# Patient Record
Sex: Male | Born: 1975
Health system: Southern US, Community
[De-identification: ages and names within clinical notes are randomized; demographics above are authoritative.]

## PROBLEM LIST (undated history)

## (undated) DIAGNOSIS — E785 Hyperlipidemia, unspecified: Secondary | ICD-10-CM

## (undated) HISTORY — DX: Hyperlipidemia, unspecified: E78.5

---

## 1998-04-13 ENCOUNTER — Emergency Department (HOSPITAL_COMMUNITY): Admission: EM | Admit: 1998-04-13 | Discharge: 1998-04-13 | Payer: Self-pay | Admitting: Emergency Medicine

## 2008-09-27 ENCOUNTER — Inpatient Hospital Stay (HOSPITAL_COMMUNITY): Admission: AC | Admit: 2008-09-27 | Discharge: 2008-09-30 | Payer: Self-pay | Admitting: Emergency Medicine

## 2008-10-05 ENCOUNTER — Inpatient Hospital Stay (HOSPITAL_COMMUNITY): Admission: RE | Admit: 2008-10-05 | Discharge: 2008-10-07 | Payer: Self-pay | Admitting: Orthopedic Surgery

## 2010-04-02 IMAGING — CT CT EXTREM UP W/O CM*L*
2 of 4 series · 7 of 14 positions shown, 8 images · non-contrast
Comparison: Plain film 09/28/2008, 1133 hours.

CLINICAL DATA: Left clavicle and scapular fracture.

CT OF THE UPPER LEFT EXTREMITY WITHOUT CONTRAST
TECHNIQUE: Multidetector CT imaging of the upper left extremity
was performed according to the standard protocol. Multiplanar CT
image reconstructions were also generated.

[Series 2: shoulder · axial · 0.52mm/px · z∈[+146,+273]mm · 4 of 169 slices shown]
[im 34/169  bone]
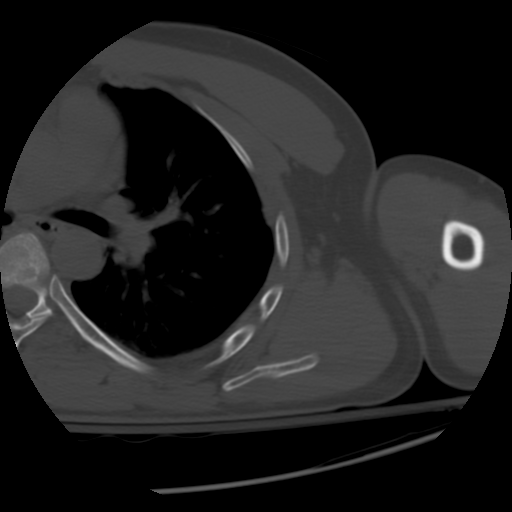
[im 68/169  bone]
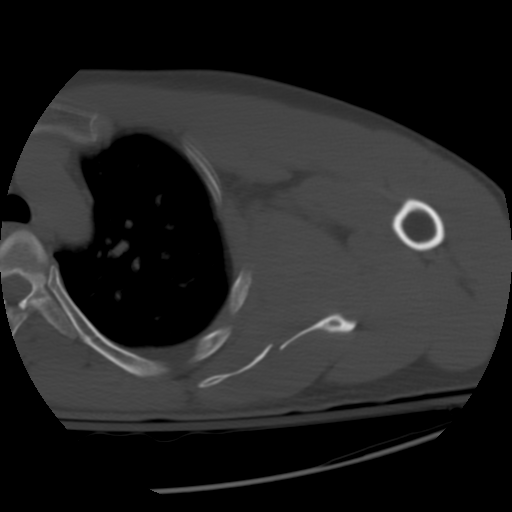
[im 101/169  bone]
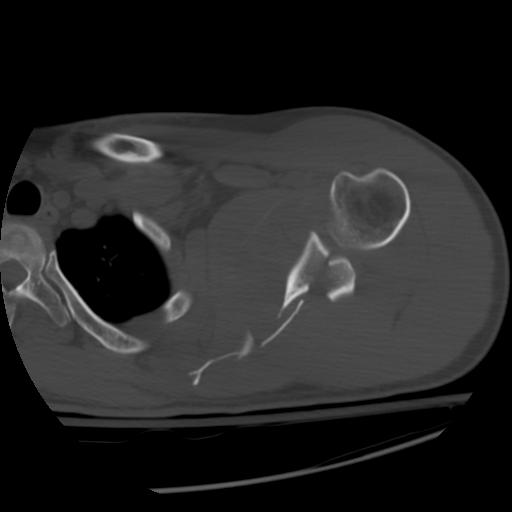
[im 135/169  bone]
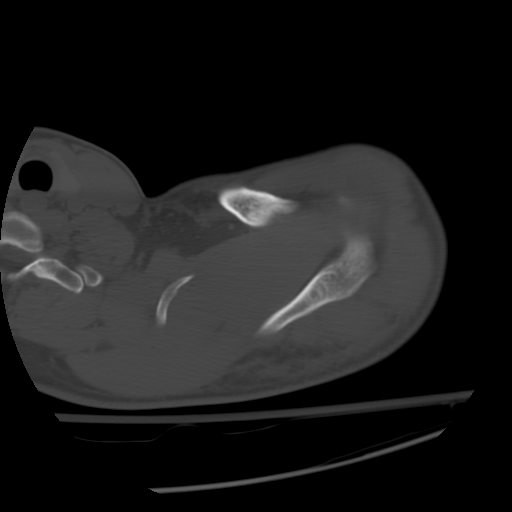

[Series 105: reformatted · axial · 0.52mm/px · z∈[+104,+343]mm · 3 of 84 slices shown, 4 images]
[im 1/84  soft-tissue]
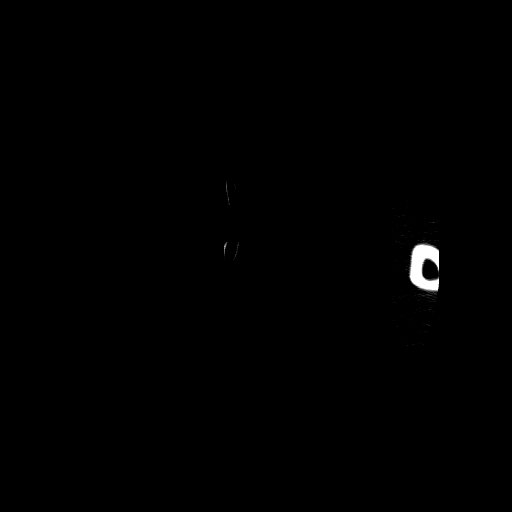
[im 1/84  bone]
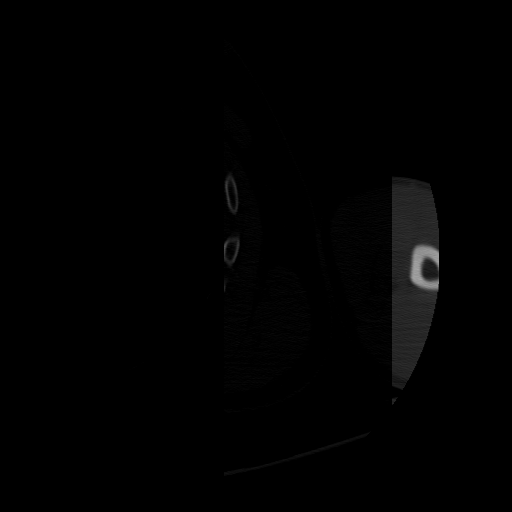
[im 42/84  bone]
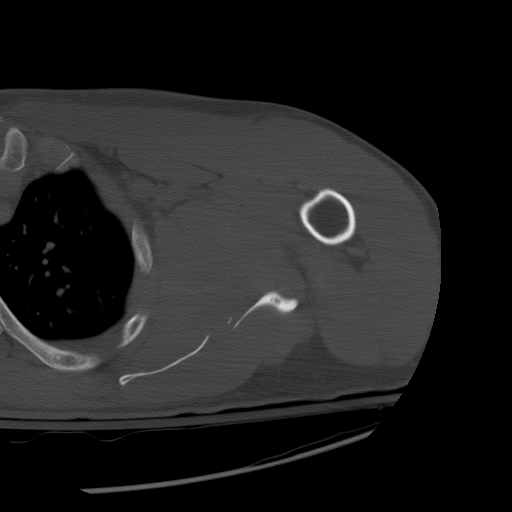
[im 84/84  bone]
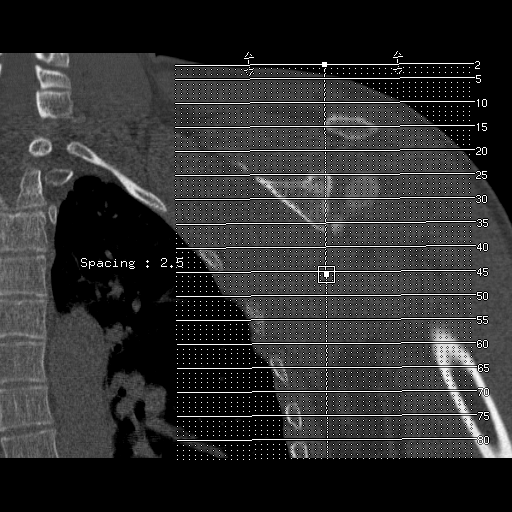

[7 of 14 positions shown; findings below may reference images not displayed]

FINDINGS: There is a comminuted fracture of the left scapula, which
extends from scapular body into the glenoid.  Glenoid fragments are
comminuted and displaced posteriorly.  The coracoid and base of the
coracoid appear intact.  Fractures do extend into the infra glenoid
tubercle.  There is posterior displacement of the lateral aspect of
the scapular body.  There is no extension of scapular body
fractures into the spine.  The fracture in the glenoid involves the
inferior one half, extending from the 4 o'clock position to the 9
o'clock position.  Humerus appears intact.  AC joint appears
intact.  Nondisplaced mid shaft clavicle fracture is present.
Visualized chest appears unremarkable.
IMPRESSION: Comminuted fracture of the left scapula extending from inferior
glenoid through the inferior scapular body.  No extension into the
scapular spine or coracoid.  Acromion is also intact.  Transverse
mid shaft left clavicle fracture.

## 2010-04-09 IMAGING — CR DG SHOULDER 1V*L*
1 series · 1 of 1 positions shown · non-contrast
Comparison: CT dated 09/28/2008

CLINICAL DATA: Left scapular ORIF.

PORTABLE LEFT SHOULDER - 2+ VIEW

[view not recorded]
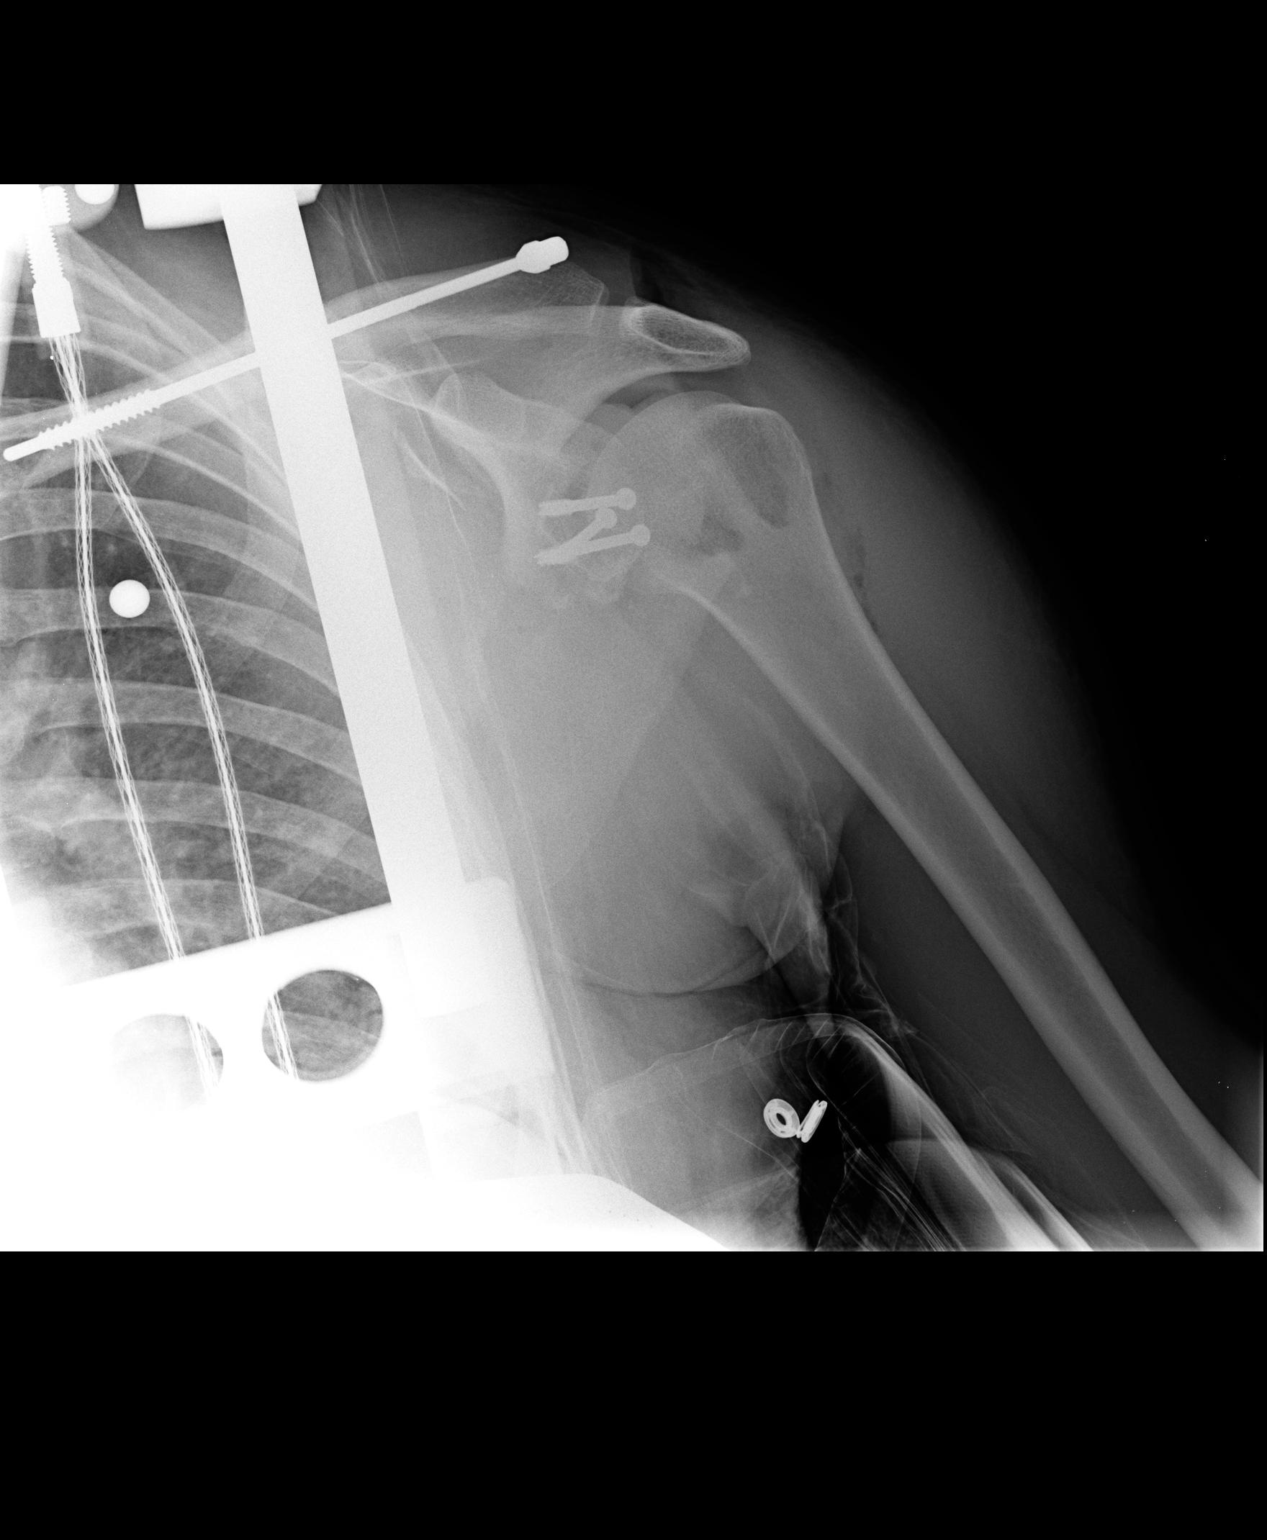

[1 of 1 positions shown; findings below may reference images not displayed]

FINDINGS: Screws are present at the level of a glenoid fracture.
In the frontal projection alignment appears unremarkable.  There is
no evidence of humeral fracture.
IMPRESSION: Imaging status post ORIF of a left glenoid scapular fracture.

## 2010-04-09 IMAGING — RF DG CLAVICLE*L*
1 series · 1 of 1 positions shown · non-contrast
Comparison: 09/28/2008

CLINICAL DATA: ORIF left clavicle fracture

LEFT CLAVICLE - 2+ VIEWS

[Series 1: run · 1 of 1 slices shown]
[im 1/1]
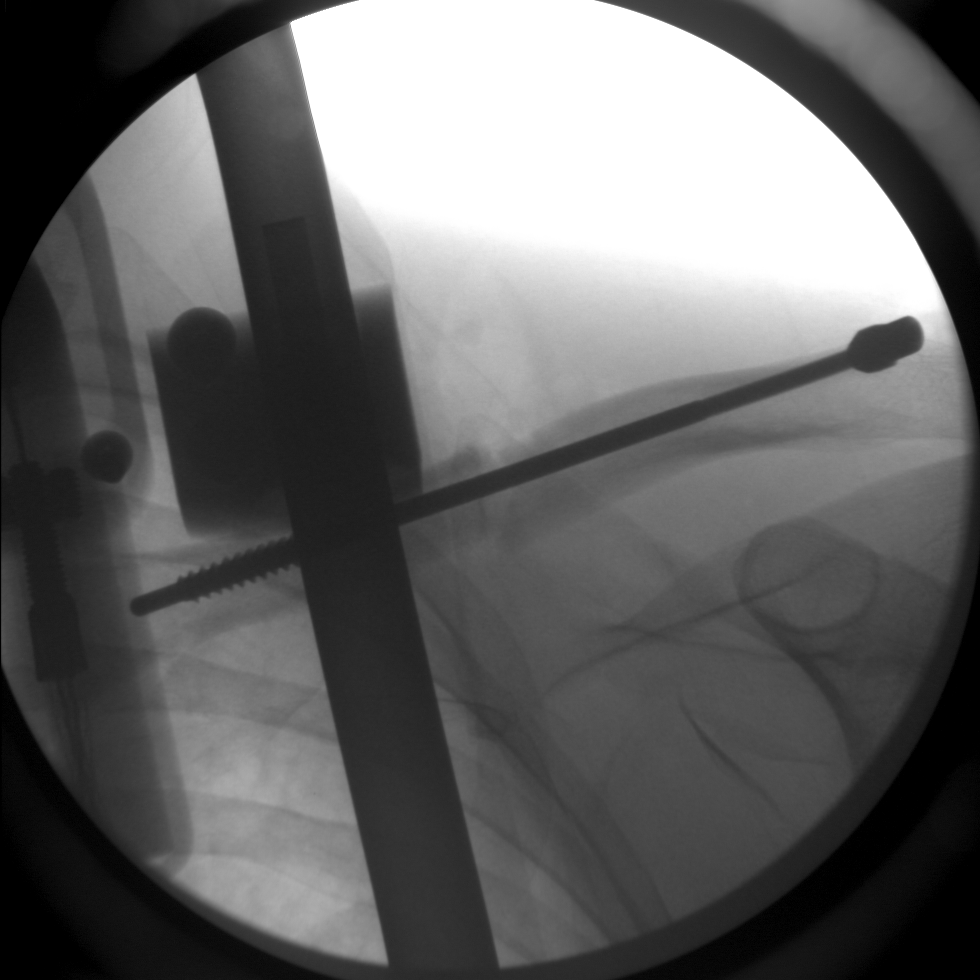

[1 of 1 positions shown; findings below may reference images not displayed]

FINDINGS: Single digital C-arm fluoroscopic image obtained intraoperatively
and submitted for interpretation.
Examination is interpreted postoperatively.
Single pin identified across mid-left clavicular fracture.
Acromioclavicular and sternoclavicular joint alignments not
visualized.
IMPRESSION: ORIF mid left clavicular fracture.

## 2010-08-28 LAB — BASIC METABOLIC PANEL
BUN: 17 mg/dL (ref 6–23)
CO2: 30 mEq/L (ref 19–32)
CO2: 28 mEq/L (ref 19–32)
Calcium: 9 mg/dL (ref 8.4–10.5)
Calcium: 9.8 mg/dL (ref 8.4–10.5)
Chloride: 103 mEq/L (ref 96–112)
GFR calc Af Amer: >60 mL/min (ref >60)
GFR calc non Af Amer: >60 mL/min (ref >60)
GFR calc non Af Amer: >60 mL/min (ref >60)
Glucose, Bld: 109 mg/dL — ABNORMAL HIGH (ref 70–99)
Glucose, Bld: 122 mg/dL — ABNORMAL HIGH (ref 70–99)
Glucose, Bld: 135 mg/dL — ABNORMAL HIGH (ref 70–99)
Potassium: 4.3 mEq/L (ref 3.5–5.1)
Sodium: 140 mEq/L (ref 135–145)
Sodium: 138 mEq/L (ref 135–145)

## 2010-08-28 LAB — PROTIME-INR: Prothrombin Time: 13.4 seconds (ref 11.6–15.2)

## 2010-08-28 LAB — CBC
HCT: 38.8 % — ABNORMAL LOW (ref 39.0–52.0)
HCT: 43.1 % (ref 39.0–52.0)
Hemoglobin: 13.7 g/dL (ref 13.0–17.0)
Hemoglobin: 14.8 g/dL (ref 13.0–17.0)
MCHC: 35.2 g/dL (ref 30.0–36.0)
MCV: 88.4 fL (ref 78.0–100.0)
Platelets: 232 10*3/uL (ref 150–400)
RBC: 4.9 MIL/uL (ref 4.22–5.81)
RBC: 5.24 MIL/uL (ref 4.22–5.81)
RDW: 12.9 % (ref 11.5–15.5)
RDW: 13.1 % (ref 11.5–15.5)
RDW: 13.4 % (ref 11.5–15.5)
WBC: 7.2 10*3/uL (ref 4.0–10.5)

## 2010-08-28 LAB — POCT I-STAT, CHEM 8
BUN: 10 mg/dL (ref 6–23)
Creatinine, Ser: 1.3 mg/dL (ref 0.4–1.5)
Hemoglobin: 16.3 g/dL (ref 13.0–17.0)
Potassium: 3.2 mEq/L — ABNORMAL LOW (ref 3.5–5.1)
Sodium: 138 mEq/L (ref 135–145)

## 2010-08-28 LAB — DIFFERENTIAL
Basophils Absolute: 0.1 10*3/uL (ref 0.0–0.1)
Basophils Relative: 1 % (ref 0–1)
Eosinophils Relative: 1 % (ref 0–5)
Lymphocytes Relative: 22 % (ref 12–46)
Lymphs Abs: 3.3 10*3/uL (ref 0.7–4.0)
Monocytes Relative: 9 % (ref 3–12)
Neutro Abs: 4 10*3/uL (ref 1.7–7.7)
Neutro Abs: 3.1 10*3/uL (ref 1.7–7.7)
Neutrophils Relative %: 43 % (ref 43–77)

## 2010-08-28 LAB — TYPE AND SCREEN
ABO/RH(D): AB NEG
ABO/RH(D): AB NEG
Antibody Screen: NEGATIVE

## 2010-08-28 LAB — ABO/RH: ABO/RH(D): AB NEG

## 2010-10-02 NOTE — Discharge Summary (Signed)
NAME:  [name] [name], [name] [name]                 ACCOUNT NO.:  [id number]   MEDICAL RECORD NO.:  [id number]          PATIENT TYPE:  INP   LOCATION:  5007                         FACILITY:  [facility]   PHYSICIAN:  Cherylynn Ridges, M.D.    DATE OF BIRTH:  05-15-1976   DATE OF ADMISSION:  09/27/2008  DATE OF DISCHARGE:  09/30/2008                               DISCHARGE SUMMARY   DISCHARGE DIAGNOSES:  1. Fall from a horse.  2. Concussion with amnesia for the event and confusion following the      event.  3. Left clavicle fracture.  4. Comminuted left scapular fracture.  5. Left shin abrasion.   ADMITTING TRAUMA SURGEON:  Gabrielle Dare. Janee Morn, MD.   Felton ClintonMadlyn Frankel. Charlann Boxer, MD, Orthopedic surgery.   PROCEDURES:  None.   HISTORY:  This is an otherwise healthy 35 year old white male who was  thrown from a horse.  There was an unknown loss of consciousness.  He  has, however, been amnesic for the event and quite confused since the  event with repetitive questions.  He came in as a level 2 trauma alert.  Workup at this time including a chest x-ray revealed a left clavicle  fracture.  Pelvic film was negative.  CT scan of the head was without  acute intracranial abnormality.  CT scan of the C-spine was negative for  acute fractures.  Chest CT scan showed left clavicle fracture and left  comminuted scapular fracture.  Abdomen and pelvic CT scan was negative  for acute injuries.  The patient was admitted for his concussion and  severe left shoulder trauma with comminuted scapular fracture and  clavicle.  The patient's mental status quickly cleared.  His wife is  available for 24-hour day supervision on discharge.   Left clavicle, scapula, and glenoid fractures:  The patient was seen in  consultation per Dr. Charlann Boxer for his orthopedic injuries, and it was felt  he would likely require ORIF of his clavicle, glenoid, and scapula.  He  will be able to discharge today with plans to have this surgery next  week, likely Wednesday of next week with Dr. Ranell Patrick.   At this time, the patient was medically stable, improved, and ready for  discharge.   MEDICATIONS AT THE TIME DISCHARGE:  1. Robaxin 500 mg p.o. q.6 h. p.r.n. muscle spasm or pain #90 with 1      refill.  2. Norco 7.5 mg 1-2 p.o. q.4-6 h. p.r.n. pain #80, no refill.  3. Colace 100 mg p.o. b.i.d.  4. MiraLax 17 g daily as needed for constipation or other laxatives as      needed for constipation.   The patient is to call to make an appointment with Dr. Ranell Patrick next week.  He can call Trauma Service as needed for questions or concerns.   Diet is regular.  He is to wear his left arm sling as instructed.      Shawn Rayburn, P.A.      Cherylynn Ridges, M.D.  Electronically Signed    SR/MEDQ  D:  09/30/2008  T:  09/30/2008  Job:  [id number]   cc:   Almedia Balls. Ranell Patrick, M.D.  Madlyn Frankel Charlann Boxer, M.D.  [facility]

## 2010-10-02 NOTE — Consult Note (Signed)
NAME:  [name], [name]                 ACCOUNT NO.:  [id number]   MEDICAL RECORD NO.:  [id number]          PATIENT TYPE:  INP   LOCATION:  5007                         FACILITY:  [facility]   PHYSICIAN:  Madlyn Frankel. Charlann Boxer, M.D.  DATE OF BIRTH:  1976-02-27   DATE OF CONSULTATION:  09/27/2008  DATE OF DISCHARGE:                                 CONSULTATION   CHIEF COMPLAINT:  Left shoulder injury.   HISTORY:  [name] is an otherwise healthy 35 year old male who was riding  his horse at home when apparently it bucked him off, landing on his left  side.  In the emergency room, he was seen and evaluated and noted to  have significant concussion with memory loss.  Trauma evaluation  included chest CT and chest x-ray indicated nondisplaced left clavicle  fracture, but scapular body fracture extended to the glenoid surface of  displacement.  Orthopedics was consulted at the time of evaluation.  He  was seen in the emergency room with some memory loss, but his wife was  present to help with evaluation.  He had no other complaints today of  right upper extremity or lower extremity issues.  He states his left  wrist and elbow feels fine.  He has no complaints of numbness and  tingling.  There is significant pain in the left shoulder area.   PAST MEDICAL HISTORY:  Negative.   PAST SURGICAL HISTORY:  Right both bone fracture with open reduction and  internal fixation followed by a removal of plate.   CURRENT MEDICATIONS:  None.   DRUG ALLERGIES:  None.   SOCIAL HISTORY:  He works as a Surveyor, minerals.  He is right-hand dominant.  Denies smoking and has social alcohol use.   REVIEW OF SYSTEMS:  Otherwise unremarkable.   PHYSICAL EXAMINATION:  GENERAL:  At this time, he is in obvious  discomfort.  He is a very pleasant male that cannot the recall the event  of the accident.  EXTREMITIES:  He has significant swelling about the left shoulder with  pain to palpation.  Limited range of motion of the shoulder  and that is  not performed.  He has ,otherwise, palpable pulses and intact  sensibility distally, light touch, as well as laterally.  No other  obvious deformity, bruising, or significant injuries to the lower  extremities or upper extremities.   Radiograph of his left shoulder were limited to the chest CT and chest x-  ray.  These reveal nondisplaced left clavicle fracture in addition to  his capsular body intraarticular glenoid fracture.   ASSESSMENT:  As above.   PLAN:  I have reviewed with [name] and his wife the current situation at  this point and we are recommending a plain x-ray evaluation of the  shoulder in addition to the CT scan of the left shoulder with  reconstruction use.  I will then consult Dr. Malon Kindle, Dr. Myrene Galas for their evaluation.  Intraarticular glenoid fracture in this  patient most likely require an open reduction and internal fixation for  better align and the glenoid surface to provide  the limited potential  for excessive intraarticular posttraumatic orthopedic disease down the  road.  Otherwise, he was found being in placed in sling and on pain  medications and observation due to his concussion, and once clear for  that, then may be an operation during this hospital stay.  He is  admitted to the Trauma Service.  We will follow him along with an  Orthopedic consult.      Madlyn Frankel Charlann Boxer, M.D.  Electronically Signed     MDO/MEDQ  D:  09/27/2008  T:  09/28/2008  Job:  [id number]

## 2010-10-02 NOTE — Op Note (Signed)
NAME:  [name] [name], [name] [name]                 ACCOUNT NO.:  [id number]   MEDICAL RECORD NO.:  [id number]          PATIENT TYPE:  INP   LOCATION:  5037                         FACILITY:  [facility]   PHYSICIAN:  Almedia Balls. Ranell Patrick, M.D. DATE OF BIRTH:  May 02, 1976   DATE OF PROCEDURE:  10/05/2008  DATE OF DISCHARGE:                               OPERATIVE REPORT   PREOPERATIVE DIAGNOSIS:  Left displaced clavicle fracture, scapular  fracture, and glenoid fracture.   PROCEDURES PERFORMED:  1. Left shoulder open reduction and internal fixation of clavicle      fracture using DePuy clavicle pin.  2. Arthroscopically-assisted left shoulder open reduction and internal      fixation of glenoid and scapular neck fracture.   SURGEON:  Almedia Balls. Ranell Patrick, MD   ASSISTANT:  Donnie Coffin. Dixon, PA-C   General anesthesia was used plus interscalene block.   ESTIMATED BLOOD LOSS:  About 200 mL   FLUID REPLACEMENT:  1800 mL crystalloid.   INSTRUMENT COUNTS:  Correct.   COMPLICATIONS:  None.   Preoperative antibiotics were given.   INDICATIONS:  The patient is a 35 year old male who suffered a fall off  of a horse injuring his left clavicle and scapula.  The patient  presented with minimally displaced clavicle fracture and a grossly  displaced scapular body fracture as well as split glenoid with  significant rotational malalignment of the posterior glenoid piece and  this was about third to half of the posterior glenoid.  After counseling  the patient regarding the need to restore the joint congruity, we  discussed options for treatment and elected to proceed with open  reduction and internal fixation of the clavicle to restore overall  stability to shoulder and the shoulder blade and as well we discussed  fixing the joint and leaving the scapular body alone.  Informed consent  was obtained.   DESCRIPTION OF PROCEDURE:  After an adequate level of anesthesia was  achieved, the patient was positioned in  modified beach chair position.  C-arm was brought, draped into the field.  After sterile prep and drape  of the shoulder, we made a small incision in the Langer's skin lines  overlying the clavicle fracture.  Dissection down through the  subcutaneous tissues, went ahead and identified the trapezius fascia,  split that and identified the fracture site.  We then drilled out the  medial fracture fragment utilizing a 3.2 drill bit and then tapped it  with 3.0 DePuy clavicle pin tap, then we ahead and drilled out the  lateral fragment and tapped the lateral fragment in the similar manner.  We then retrograded a 3.0 DePuy clavicle pin out the lateral fragments,  reduced the fracture and antegraded across.  Prior to fully seating the  pin, we went ahead and placed our nuts at the appropriate length, the  medial and lateral nut and then cold-welded was in place, and then we  went ahead and clipped the pin and then smoothened the end of the pin  off with a rasp.  We then fully advanced the pin and because of the  obliquity of the fracture, there was just some slight bit of overlap of  that fracture site, but we had good compression at the fracture site.  At this point, we thoroughly irrigated and closed with layer closure of  0 Vicryl, 2-0 Vicryl, and 4-0 Monocryl for the skin.  We next approached  the scapular fracture from the posterior approach.  This was a  longitudinal skin incision, trying to stay in the skin line to the  shoulder starting at the acromion and extending down along the joint  line.  Dissection down through the subcutaneous tissues using Bovie, the  deltoid was identified and elevated, so it was not detached.  We then  identified the raphe between the infraspinatus and the teres minor and  developed that.  I could feel the fractured fragments of bone palpable  with my finger.  Next, we went ahead and just opened up that muscular  interval.  We were careful not to injure the  suprascapular nerve.  We  were also careful to avoid injury to the posterior humeral circumflex  vessels, which were visible as well as the axillary nerve which was not  visualized.  At this point, we identified posteriorly several large  pieces of bone.  I made a decision at this point to perform an  arthroscopically-assisted reduction of the fragment to try to avoid  entering the joint and violating the labrum in the capsule posteriorly.  We made a single portal just medial to the coracoid process and looked  from the anterior portal posteriorly with a scope.  We just went with  about 20 mmHg pressure with minimal pressure in the joint.  We irrigated  the joint out real well.  We noticed there to be a significant defect or  hole in the glenoid.  This appeared to be centered just inferior  __________ and a little bit posterior of the midline, just appeared to  be a big divot in the glenoid.  The humeral head appeared to be in good  shape.  The glenoid labrum appeared to be intact all the way round.  At  this point, I was able to identify through pushing on the different bone  pieces posteriorly identified.  The piece actually was contiguous with  the glenoid and was able to rotate that back in anatomic position.  This  restored the joint line arthroscopically and I did have some local  landmarks posteriorly that I could see through the external wound that  allowed me to make sure that this was anatomically married out.  We  placed several 1.6 guide pins and then went ahead and placed the guide  pins for the 4.0 cannulated screws and used a total of 3 screws, 2  closer to the joint line and 1 little bit more metaphyseal and the  glenoid neck.  These gained excellent purchase.  I was able to check the  lengths of these screws using the scope and I could see those coming out  definitely clear of the joint making sure that this did not violate the  joint and these were just anterior cortex of  the glenoid neck.  At this  point satisfied, we had appropriate joint reduction.  We made a final  inspection of the posterior humeral circumflex vessels as they were  intact.  My one concern was a large fragment of what appeared to be  scapular body possibly the inferolateral border of the scapula that  seemed to be displaced laterally.  This particular piece of bone despite  using a Freer and a Cobb and my finger trying to free this piece of bone  up.  It still had significant muscular attachment and did not seem to  want to move at all, and it did seem to marry up on the under side of  the piece of bone that we have pushed back in place that had the glenoid  attached to it.  Because it was fairly close to that and I was concerned  about injuring the blood vessels in this area, I went ahead and simply  blunted the sharp end of that bone with a rongeur and then left it in  its position where it seemed to want to be.  I just simply did not want  to move and I was very worried about potentially tearing blood vessels  or injuring nerves, trying to move this piece of bone and thus it was  left alone.  We will obtain a postoperative CT to assess with these  pieces and I will review that with Dr. Myrene Galas, an Orthopedic  traumatologist here at [facility] to see if he feels that needs  to be removed or taken out.  At this point, we were very pleased with  articular reduction.  We did have C-arm and assisting as to we could not  get a great axillary view and thus, we were able to get some views  demonstrating a restoration of the glenoid shape, but just not perfect  tangential axillary view.  We also had an intraoperative x-ray that  demonstrated things to be in good position and at this point, we  concluded surgery doing a layered closure with Vicryl and Monocryl.  Steri-Strips applied followed by sterile dressing and a shoulder sling.  The patient tolerated the surgery  well.     Almedia Balls. Ranell Patrick, M.D.  Electronically Signed    SRN/MEDQ  D:  10/05/2008  T:  10/06/2008  Job:  [id number]

## 2010-10-02 NOTE — H&P (Signed)
NAME:  [name] [name], [name] [name]                 ACCOUNT NO.:  [id number]   MEDICAL RECORD NO.:  [id number]          PATIENT TYPE:  INP   LOCATION:  5007                         FACILITY:  [facility]   PHYSICIAN:  Gabrielle Dare. Janee Morn, M.D.DATE OF BIRTH:  Jan 30, 1976   DATE OF ADMISSION:  09/27/2008  DATE OF DISCHARGE:                              HISTORY & PHYSICAL   CHIEF COMPLAINT:  Left shoulder pain and confusion after being thrown  from a horse.   HISTORY OF PRESENT ILLNESS:  [name] [name] is a 35 year old white male who  was thrown from a horse in his front yard.  He has been confused since  the event.  He came in as a level 2 trauma.  Workup in the emergency  department shows concussion, left clavicle fracture and left scapula  fracture.  We are asked to admit to the trauma service.   PAST MEDICAL HISTORY:  Negative.   PAST SURGICAL HISTORY:  Right forearm ORIF.   SOCIAL HISTORY:  He does not use drugs.  He does not smoke.  Drinks  alcohol occasionally.  He works as a Surveyor, minerals.   ALLERGIES:  No known drug allergies.   MEDICATIONS:  None.   REVIEW OF SYSTEMS:  MUSCULOSKELETAL:  He has left shoulder pain  extending down the arm.  This involves both the scapula area and the  clavicle area.  NEUROLOGIC:  He is amnestic to the event and repetitive.  Remainder of  the review of systems were unremarkable.   PHYSICAL EXAMINATION:  VITAL SIGNS:  Temperature 98.1, pulse 102,  respirations 20, blood pressure 150/111, saturations 100%.  HEENT:  Head is normocephalic and atraumatic.  Eyes:  Pupils are equal  and reactive.  Sclerae are clear.  Ears:  Has some cerumen on the left  canal, but no hemotympanum was seen bilaterally.  Face:  Symmetric and  nontender.  NECK:  Has no step-offs or tenderness.  There is no pain on active range  of motion.  His cervical collar was discontinued.  PULMONARY:  Lungs are clear to auscultation, but he has some significant  tenderness of the left clavicle and the left  scapula.  CARDIOVASCULAR:  Heart is regular with no murmurs and pulses are  palpable on the left chest.  ABDOMEN:  Soft and nontender.  Bowel sounds are hypoactive.  No  organomegaly is noted.  No masses are felt.  PELVIS:  Stable anteriorly.  MUSCULOSKELETAL:  He has an abrasion on his left shin.  No other  deformity or significant bony tenderness is felt except for as above.  BACK:  No step-offs or tenderness along the midline.  NEUROLOGIC:  Glasgow Coma Scale is 15.  He is repetitive and confused at  times, but does follow commands, and is oriented.   LABORATORY STUDIES:  Sodium 138, potassium 3.2, chloride 105, BUN 10,  creatinine 1.3.  White blood cell count 7.2, hemoglobin 15.9, platelets  175.  Chest x-ray shows left clavicle fracture.  Pelvis x-ray negative.  CT scan of the head negative.  CT scan of the cervical spine negative.  CT scan of  the chest shows left clavicle fracture and left scapula  fracture.  CT scan of the abdomen and pelvis was negative.   IMPRESSION:  A 35 year old white male who is thrown from a horse with:  1. Concussion.  2. Left clavicle fracture.  3. Left scapula fracture.  4. Abrasion on the left shin.   PLAN:  To admit him to trauma service and they have requested  Orthopedics consult, which is nonurgent, and I spoke with Dr. Durene Romans.  We will also place his left arm in a sling.      Gabrielle Dare Janee Morn, M.D.  Electronically Signed     BET/MEDQ  D:  09/27/2008  T:  09/28/2008  Job:  [id number]

## 2010-10-05 NOTE — Discharge Summary (Signed)
NAME:  G[place], [place]                 ACCOUNT NO.:  [id number]   MEDICAL RECORD NO.:  [id number]          PATIENT TYPE:  INP   LOCATION:  5037                         FACILITY:  [facility]   PHYSICIAN:  Almedia Balls. Ranell Patrick, M.D. DATE OF BIRTH:  April 30, 1976   DATE OF ADMISSION:  10/05/2008  DATE OF DISCHARGE:  10/07/2008                               DISCHARGE SUMMARY   ADMISSION DIAGNOSIS:  Left displaced clavicle fracture and scapular  fracture and glenoid fracture.   DISCHARGE DIAGNOSIS:  Left displaced clavicle fracture and scapular  fracture and glenoid fracture.  status post open reduction and internal fixation.   BRIEF HISTORY:  The patient is a 35 year old male who sustained a fall  from a horse injuring his left shoulder.  The patient was discharged  initially from the trauma service for several days until surgery could  be arranged and then was brought back in on Oct 05, 2008, for surgical  ORIF of his left shoulder.   PROCEDURE:  The patient had a left clavicle ORIF and arthroscopically  guided left open glenoid ORIF.  Surgeon was Malon Kindle, MD.  Assistant was Standley Dakins, PA-C.  General anesthesia was used.  No  complications.   HOSPITAL COURSE:  The patient was admitted on Oct 05, 2008, for the  above-stated procedure, which he tolerated well.  After adequate time in  post anesthesia care unit, he was transferred up to 5000.  Postop day 1,  the patient complained of moderate pain to that left shoulder.  Neurovascularly, he was intact.  Sling was in place.  He went through a  little bit of occupational therapy without very much difficulty.  The  patient had his drain removed, and he was weaning from his PCA.  Postop  day 2, the patient was feeling somewhat better, still had some mild to  moderate soreness in that left shoulder but otherwise doing okay.  The  patient was to be discharged home later that afternoon.   DISCHARGE/PLAN:  The patient was discharged home on Oct 07, 2008.  His  condition is stable.  His diet is regular.   DISCHARGE MEDICATIONS:  1. Robaxin 500 mg p.o. q.6 h.  2. Percocet 5/325 one to two tablets q.4-6 h. p.r.n. pain.   FOLLOWUP:  The patient will follow back up with Dr. Malon Kindle in 2  weeks.     Thomas B. Durwin Nora, P.A.      Almedia Balls. Ranell Patrick, M.D.  Electronically Signed   TBD/MEDQ  D:  11/23/2008  T:  11/23/2008  Job:  [id number]

## 2011-05-21 HISTORY — PX: SHOULDER SURGERY: SHX246

## 2017-03-17 ENCOUNTER — Encounter: Payer: Self-pay | Admitting: Physician Assistant

## 2017-03-17 ENCOUNTER — Ambulatory Visit (INDEPENDENT_AMBULATORY_CARE_PROVIDER_SITE_OTHER): Payer: 59 | Admitting: Physician Assistant

## 2017-03-17 ENCOUNTER — Ambulatory Visit (HOSPITAL_COMMUNITY)
Admission: RE | Admit: 2017-03-17 | Discharge: 2017-03-17 | Disposition: A | Discharge status: Home / Self Care | Payer: 59 | Source: Ambulatory Visit | Attending: Physician Assistant | Admitting: Physician Assistant

## 2017-03-17 VITALS — BP 122/76 | HR 76 | Temp 97.3°F | Resp 18 | Ht 71.0 in | Wt 195.8 lb

## 2017-03-17 DIAGNOSIS — Z6827 Body mass index (BMI) 27.0-27.9, adult: Secondary | ICD-10-CM

## 2017-03-17 DIAGNOSIS — Z79899 Other long term (current) drug therapy: Secondary | ICD-10-CM

## 2017-03-17 DIAGNOSIS — Z22322 Carrier or suspected carrier of Methicillin resistant Staphylococcus aureus: Secondary | ICD-10-CM | POA: Diagnosis not present

## 2017-03-17 DIAGNOSIS — Z0001 Encounter for general adult medical examination with abnormal findings: Secondary | ICD-10-CM | POA: Diagnosis not present

## 2017-03-17 DIAGNOSIS — Z136 Encounter for screening for cardiovascular disorders: Secondary | ICD-10-CM | POA: Diagnosis not present

## 2017-03-17 DIAGNOSIS — Z114 Encounter for screening for human immunodeficiency virus [HIV]: Secondary | ICD-10-CM

## 2017-03-17 DIAGNOSIS — R0602 Shortness of breath: Secondary | ICD-10-CM | POA: Insufficient documentation

## 2017-03-17 DIAGNOSIS — Z13220 Encounter for screening for lipoid disorders: Secondary | ICD-10-CM

## 2017-03-17 DIAGNOSIS — R079 Chest pain, unspecified: Secondary | ICD-10-CM

## 2017-03-17 DIAGNOSIS — E559 Vitamin D deficiency, unspecified: Secondary | ICD-10-CM | POA: Diagnosis not present

## 2017-03-17 DIAGNOSIS — R6889 Other general symptoms and signs: Secondary | ICD-10-CM

## 2017-03-17 DIAGNOSIS — F419 Anxiety disorder, unspecified: Secondary | ICD-10-CM | POA: Diagnosis not present

## 2017-03-17 DIAGNOSIS — Z23 Encounter for immunization: Secondary | ICD-10-CM | POA: Diagnosis not present

## 2017-03-17 DIAGNOSIS — Z1389 Encounter for screening for other disorder: Secondary | ICD-10-CM

## 2017-03-17 DIAGNOSIS — Z131 Encounter for screening for diabetes mellitus: Secondary | ICD-10-CM

## 2017-03-17 DIAGNOSIS — R0789 Other chest pain: Secondary | ICD-10-CM | POA: Insufficient documentation

## 2017-03-17 DIAGNOSIS — Z130 Encounter for screening for diseases of the blood and blood-forming organs and certain disorders involving the immune mechanism: Secondary | ICD-10-CM

## 2017-03-17 DIAGNOSIS — K219 Gastro-esophageal reflux disease without esophagitis: Secondary | ICD-10-CM

## 2017-03-17 DIAGNOSIS — Z125 Encounter for screening for malignant neoplasm of prostate: Secondary | ICD-10-CM

## 2017-03-17 HISTORY — DX: Carrier or suspected carrier of methicillin resistant Staphylococcus aureus: Z22.322

## 2017-03-17 NOTE — Addendum Note (Signed)
Addended by: Quentin MullingOLLIER, Ledonna Dormer R on: 03/17/2017 12:12 PM   Modules accepted: Level of Service

## 2017-03-17 NOTE — Progress Notes (Signed)
Complete Physical  Assessment and Plan:  BMI 27.0-27.9,adult Monitor weight, increase veggies  Needs flu shot -     FLU VACCINE MDCK QUAD W/Preservative  Need for diphtheria-tetanus-pertussis (Tdap) vaccine -     Tdap vaccine greater than or equal to 42yo IM  MRSA colonization Will monitor, had treatment recently, may need to treat family  Medication management -     CBC with Differential/Platelet -     BASIC METABOLIC PANEL WITH GFR -     Hepatic function panel -     Magnesium  Vitamin D deficiency -     VITAMIN D 25 Hydroxy (Vit-D Deficiency, Fractures)  Encounter for general adult medical examination with abnormal findings  Chest pain, unspecified type Low risk, remote history of smoking 15 years ago, maternal aunt with heart issues at 4760, unknown cholesterol and A1C status, will check today but with generalized CP with SOB with exertion will refer to cardio, EKG normal, gets labs Versus anxiety/GERd, get on zantac at night -     EKG 12-Lead -     Troponin I -     [facility]card[facility] -     Ambulatory referral to Cardiology  Shortness of breath -     DG Chest 2 View; Future -     Ambulatory referral to Cardiology  Gastroesophageal reflux disease without esophagitis Continue PPI/H2 blocker, diet discussed  Anxiety -     TSH - declines meds at this time, will check labs  Screening for diabetes mellitus -     Hemoglobin A1c  Screening cholesterol level -     Lipid panel  Screening for blood or protein in urine -     Urinalysis, Routine w reflex microscopic -     Microalbumin / creatinine urine ratio  Screening, anemia, deficiency, iron -     Iron,Total/Total Iron Binding Cap -     Vitamin B12  Screening PSA (prostate specific antigen) -     PSA  Encounter for screening for HIV -     HIV antibody  Discussed med's effects and SE's. Screening labs and tests as requested with regular follow-up as recommended. Over 40 minutes of exam,  counseling, chart review and critical decision making was performed  HPI Patient presents for a complete physical.   His blood pressure has been controlled at home, today their BP is BP: 122/76 He does not workout, but very physical job. He denies chest pain, shortness of breath, dizziness.  Has been having chest discomfort x 2-3 weeks, none for last week until this AM. Left sided chest pressure, no radiation, with SOB, with fatigue with exertion and not with exertion. Has had some dizziness with standing, no other accompaniments with the chest pressure. Would be constant, last for hours, aspirin helped some and lying down. No cough, wheezing, fever, chills, swelling in his legs. No recent tick exposure.  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 Some palpitations with stress.  Had colonoscopy 2014 in [place].  Has horses at their house, wife is [name]. Has daughter [id number] and son [id number].    Current Medications:  No current outpatient prescriptions on file prior to visit.   No current facility-administered medications on file prior to visit.    Allergies:  No Known Allergies   Health Maintenance:  There is no immunization history for the selected administration types on file for this patient.  Tetanus:  TODAY Pneumovax: n/a Prevnar 13: n/a Flu vaccine: TODAY Zostavax:n/a  DEXA: Colonoscopy: had 2014, will get exact date EGD:  Patient Care Team: [name], MD as PCP - General (Internal Medicine)  Medical History:   does not have a problem list on file. Surgical History:  He  has a past surgical history that includes Shoulder surgery (2013). Family History:  His family history includes Depression in his mother; Hypertension in his mother; Migraines in his mother. Social History:   reports that he has quit smoking. He has never used smokeless tobacco. His alcohol and drug histories are not on file.   Review  of Systems:  Review of Systems  Constitutional: Positive for malaise/fatigue. Negative for chills, diaphoresis, fever and weight loss.  HENT: Negative.   Eyes: Negative.   Respiratory: Positive for shortness of breath. Negative for cough, hemoptysis, sputum production and wheezing.   Cardiovascular: Positive for chest pain. Negative for palpitations, orthopnea, claudication, leg swelling and PND.  Gastrointestinal: Positive for heartburn. Negative for abdominal pain, blood in stool, constipation, diarrhea, melena, nausea and vomiting.  Genitourinary: Negative.   Musculoskeletal: Negative.   Skin: Negative.  Negative for rash.  Neurological: Negative for weakness.    Physical Exam: Estimated body mass index is 27.31 kg/m as calculated from the following:   Height as of this encounter: 5\' 11"  (1.803 m).   Weight as of this encounter: 195 lb 12.8 oz (88.8 kg). BP 122/76   Pulse 76   Temp (!) 97.3 F (36.3 C)   Resp 18   Ht 5\' 11"  (1.803 m)   Wt 195 lb 12.8 oz (88.8 kg)   BMI 27.31 kg/m  General Appearance: Well nourished, in no apparent distress.  Eyes: PERRLA, EOMs, conjunctiva no swelling or erythema, normal fundi and vessels.  Sinuses: No Frontal/maxillary tenderness  ENT/Mouth: Ext aud canals clear, normal light reflex with TMs without erythema, bulging. Good dentition. No erythema, swelling, or exudate on post pharynx. Tonsils not swollen or erythematous. Hearing normal.  Neck: Supple, thyroid normal. No bruits  Respiratory: Respiratory effort normal, BS equal bilaterally without rales, rhonchi, wheezing or stridor.  Cardio: RRR without murmurs, rubs or gallops. Brisk peripheral pulses without edema.  Chest: symmetric, with normal excursions and percussion.  Abdomen: Soft, nontender, no guarding, rebound, hernias, masses, or organomegaly.  Lymphatics: Non tender without lymphadenopathy.  Genitourinary: defer Musculoskeletal: Full ROM all peripheral extremities,5/5 strength,  and normal gait.  Skin: Warm, dry without rashes, lesions, ecchymosis. Neuro: Cranial nerves intact, reflexes equal bilaterally. Normal muscle tone, no cerebellar symptoms. Sensation intact.  Psych: Awake and oriented X 3, normal affect, Insight and Judgment appropriate.   EKG: WNL no ST changes. AORTA SCAN: defer  [name] [name] 9:14 AM [facility]

## 2017-03-17 NOTE — Patient Instructions (Signed)
Start zantac 150-300 mg OR pepcid at night for 2 weeks, then you can stop.  Avoid alcohol, spicy foods, NSAIDS (aleve, ibuprofen) at this time. See foods below.   Food Choices for Gastroesophageal Reflux Disease When you have gastroesophageal reflux disease (GERD), the foods you eat and your eating habits are very important. Choosing the right foods can help ease the discomfort of GERD. WHAT GENERAL GUIDELINES DO I NEED TO FOLLOW?  Choose fruits, vegetables, whole grains, low-fat dairy products, and low-fat meat, fish, and poultry.  Limit fats such as oils, salad dressings, butter, nuts, and avocado.  Keep a food diary to identify foods that cause symptoms.  Avoid foods that cause reflux. These may be different for different people.  Eat frequent small meals instead of three large meals each day.  Eat your meals slowly, in a relaxed setting.  Limit fried foods.  Cook foods using methods other than frying.  Avoid drinking alcohol.  Avoid drinking large amounts of liquids with your meals.  Avoid bending over or lying down until 2-3 hours after eating. WHAT FOODS ARE NOT RECOMMENDED? The following are some foods and drinks that may worsen your symptoms: Vegetables Tomatoes. Tomato juice. Tomato and spaghetti sauce. Chili peppers. Onion and garlic. Horseradish. Fruits Oranges, grapefruit, and lemon (fruit and juice). Meats High-fat meats, fish, and poultry. This includes hot dogs, ribs, ham, sausage, salami, and bacon. Dairy Whole milk and chocolate milk. Sour cream. Cream. Butter. Ice cream. Cream cheese.  Beverages Coffee and tea, with or without caffeine. Carbonated beverages or energy drinks. Condiments Hot sauce. Barbecue sauce.  Sweets/Desserts Chocolate and cocoa. Donuts. Peppermint and spearmint. Fats and Oils High-fat foods, including [place] fries and potato chips. Other Vinegar. Strong spices, such as black pepper, white pepper, red pepper, cayenne, curry powder,  cloves, ginger, and chili powder.    Nonspecific Chest Pain Chest pain can be caused by many different conditions. There is always a chance that your pain could be related to something serious, such as a heart attack or a blood clot in your lungs. Chest pain can also be caused by conditions that are not life-threatening. If you have chest pain, it is very important to follow up with your health care provider. What are the causes? Causes of this condition include:  Heartburn.  Pneumonia or bronchitis.  Anxiety or stress.  Inflammation around your heart (pericarditis) or lung (pleuritis or pleurisy).  A blood clot in your lung.  A collapsed lung (pneumothorax). This can develop suddenly on its own (spontaneous pneumothorax) or from trauma to the chest.  Shingles infection (varicella-zoster virus).  Heart attack.  Damage to the bones, muscles, and cartilage that make up your chest wall. This can include: ? Bruised bones due to injury. ? Strained muscles or cartilage due to frequent or repeated coughing or overwork. ? Fracture to one or more ribs. ? Sore cartilage due to inflammation (costochondritis).  What increases the risk? Risk factors for this condition may include:  Activities that increase your risk for trauma or injury to your chest.  Respiratory infections or conditions that cause frequent coughing.  Medical conditions or overeating that can cause heartburn.  Heart disease or family history of heart disease.  Conditions or health behaviors that increase your risk of developing a blood clot.  Having had chicken pox (varicella zoster).  What are the signs or symptoms? Chest pain can feel like:  Burning or tingling on the surface of your chest or deep in your chest.  Crushing,  pressure, aching, or squeezing pain.  Dull or sharp pain that is worse when you move, cough, or take a deep breath.  Pain that is also felt in your back, neck, shoulder, or arm, or pain  that spreads to any of these areas.  Your chest pain may come and go, or it may stay constant. How is this diagnosed? Lab tests or other studies may be needed to find the cause of your pain. Your health care provider may have you take a test called an ECG (electrocardiogram). An ECG records your heartbeat patterns at the time the test is performed. You may also have other tests, such as:  Transthoracic echocardiogram (TTE). In this test, sound waves are used to create a picture of the heart structures and to look at how blood flows through your heart.  Transesophageal echocardiogram (TEE).This is a more advanced imaging test that takes images from inside your body. It allows your health care provider to see your heart in finer detail.  Cardiac monitoring. This allows your health care provider to monitor your heart rate and rhythm in real time.  Holter monitor. This is a portable device that records your heartbeat and can help to diagnose abnormal heartbeats. It allows your health care provider to track your heart activity for several days, if needed.  Stress tests. These can be done through exercise or by taking medicine that makes your heart beat more quickly.  Blood tests.  Other imaging tests.  How is this treated? Treatment depends on what is causing your chest pain. Treatment may include:  Medicines. These may include: ? Acid blockers for heartburn. ? Anti-inflammatory medicine. ? Pain medicine for inflammatory conditions. ? Antibiotic medicine, if an infection is present. ? Medicines to dissolve blood clots. ? Medicines to treat coronary artery disease (CAD).  Supportive care for conditions that do not require medicines. This may include: ? Resting. ? Applying heat or cold packs to injured areas. ? Limiting activities until pain decreases.  Follow these instructions at home: Medicines  If you were prescribed an antibiotic, take it as told by your health care provider. Do  not stop taking the antibiotic even if you start to feel better.  Take over-the-counter and prescription medicines only as told by your health care provider. Lifestyle  Do not use any products that contain nicotine or tobacco, such as cigarettes and e-cigarettes. If you need help quitting, ask your health care provider.  Do not drink alcohol.  Make lifestyle changes as directed by your health care provider. These may include: ? Getting regular exercise. Ask your health care provider to suggest some activities that are safe for you. ? Eating a heart-healthy diet. A registered dietitian can help you to learn healthy eating options. ? Maintaining a healthy weight. ? Managing diabetes, if necessary. ? Reducing stress, such as with yoga or relaxation techniques. General instructions  Avoid any activities that bring on chest pain.  If heartburn is the cause for your chest pain, raise (elevate) the head of your bed about 6 inches (15 cm) by putting blocks under the legs. Sleeping with more pillows does not effectively relieve heartburn because it only changes the position of your head.  Keep all follow-up visits as told by your health care provider. This is important. This includes any further testing if your chest pain does not go away. Contact a health care provider if:  Your chest pain does not go away.  You have a rash with blisters on your chest.  You have a fever.  You have chills. Get help right away if:  Your chest pain is worse.  You have a cough that gets worse, or you cough up blood.  You have severe pain in your abdomen.  You have severe weakness.  You faint.  You have sudden, unexplained chest discomfort.  You have sudden, unexplained discomfort in your arms, back, neck, or jaw.  You have shortness of breath at any time.  You suddenly start to sweat, or your skin gets clammy.  You feel nauseous or you vomit.  You suddenly feel light-headed or dizzy.  Your  heart begins to beat quickly, or it feels like it is skipping beats. These symptoms may represent a serious problem that is an emergency. Do not wait to see if the symptoms will go away. Get medical help right away. Call your local emergency services (911 in the [place] Do not drive yourself to the hospital. This information is not intended to replace advice given to you by your health care provider. Make sure you discuss any questions you have with your health care provider. Document Released: 02/13/2005 Document Revised: 01/29/2016 Document Reviewed: 01/29/2016 Elsevier Interactive Patient Education  2017 [employer]

## 2017-03-17 NOTE — Progress Notes (Signed)
LVM for pt to return office call for LAB results.

## 2017-03-18 ENCOUNTER — Encounter: Payer: Self-pay | Admitting: Physician Assistant

## 2017-03-18 DIAGNOSIS — E785 Hyperlipidemia, unspecified: Secondary | ICD-10-CM

## 2017-03-18 HISTORY — DX: Hyperlipidemia, unspecified: E78.5

## 2017-03-18 LAB — CBC WITH DIFFERENTIAL/PLATELET
Basophils Absolute: 30 cells/uL (ref 0–200)
Basophils Relative: 0.6 %
EOS PCT: 1.4 %
Eosinophils Absolute: 70 cells/uL (ref 15–500)
HCT: 49.7 % (ref 38.5–50.0)
Hemoglobin: 17 g/dL (ref 13.2–17.1)
LYMPHS ABS: 2160 {cells}/uL (ref 850–3900)
MCH: 29.4 pg (ref 27.0–33.0)
MCHC: 34.2 g/dL (ref 32.0–36.0)
MCV: 85.8 fL (ref 80.0–100.0)
MPV: 11.2 fL (ref 7.5–12.5)
Monocytes Relative: 7.9 %
Neutro Abs: 2345 cells/uL (ref 1500–7800)
Neutrophils Relative %: 46.9 %
PLATELETS: 223 10*3/uL (ref 140–400)
RBC: 5.79 10*6/uL (ref 4.20–5.80)
RDW: 12.9 % (ref 11.0–15.0)
TOTAL LYMPHOCYTE: 43.2 %
WBC mixed population: 395 cells/uL (ref 200–950)
WBC: 5 10*3/uL (ref 3.8–10.8)

## 2017-03-18 LAB — BASIC METABOLIC PANEL WITH GFR
BUN: 14 mg/dL (ref 7–25)
CALCIUM: 9.9 mg/dL (ref 8.6–10.3)
CHLORIDE: 100 mmol/L (ref 98–110)
CO2: 32 mmol/L (ref 20–32)
Creat: 1.03 mg/dL (ref 0.60–1.35)
GFR, Est African American: 103 mL/min/{1.73_m2} (ref >=60)
GFR, Est Non African American: 89 mL/min/{1.73_m2} (ref >=60)
Glucose, Bld: 93 mg/dL (ref 65–99)
POTASSIUM: 4.6 mmol/L (ref 3.5–5.3)
Sodium: 140 mmol/L (ref 135–146)

## 2017-03-18 LAB — HEMOGLOBIN A1C
HEMOGLOBIN A1C: 5.3 %{Hb} (ref <5.7)
MEAN PLASMA GLUCOSE: 105 (calc)
eAG (mmol/L): 5.8 (calc)

## 2017-03-18 LAB — HEPATIC FUNCTION PANEL
AG Ratio: 1.6 (calc) (ref 1.0–2.5)
ALT: 37 U/L (ref 9–46)
AST: 20 U/L (ref 10–40)
Albumin: 4.8 g/dL (ref 3.6–5.1)
Alkaline phosphatase (APISO): 58 U/L (ref 40–115)
BILIRUBIN DIRECT: 0.1 mg/dL (ref 0.0–0.2)
Globulin: 3 g/dL (calc) (ref 1.9–3.7)
Indirect Bilirubin: 0.3 mg/dL (calc) (ref 0.2–1.2)
Total Bilirubin: 0.4 mg/dL (ref 0.2–1.2)
Total Protein: 7.8 g/dL (ref 6.1–8.1)

## 2017-03-18 LAB — PSA: PSA: 0.8 ng/mL (ref <=4.0)

## 2017-03-18 LAB — TSH: TSH: 2.36 m[IU]/L (ref 0.40–4.50)

## 2017-03-18 LAB — IRON, TOTAL/TOTAL IRON BINDING CAP
%SAT: 23 % (calc) (ref 15–60)
Iron: 77 ug/dL (ref 50–180)
TIBC: 340 ug/dL (ref 250–425)

## 2017-03-18 LAB — URINALYSIS, ROUTINE W REFLEX MICROSCOPIC
Bilirubin Urine: NEGATIVE
Glucose, UA: NEGATIVE
Hgb urine dipstick: NEGATIVE
Ketones, ur: NEGATIVE
LEUKOCYTES UA: NEGATIVE
NITRITE: NEGATIVE
PH: 7 (ref 5.0–8.0)
Protein, ur: NEGATIVE
SPECIFIC GRAVITY, URINE: 1.021 (ref 1.001–1.03)

## 2017-03-18 LAB — LIPID PANEL
Cholesterol: 261 mg/dL — ABNORMAL HIGH (ref <200)
HDL: 43 mg/dL (ref >40)
LDL Cholesterol (Calc): 180 mg/dL (calc) — ABNORMAL HIGH
Non-HDL Cholesterol (Calc): 218 mg/dL (calc) — ABNORMAL HIGH (ref <130)
TRIGLYCERIDES: 211 mg/dL — AB (ref <150)
Total CHOL/HDL Ratio: 6.1 (calc) — ABNORMAL HIGH (ref <5.0)

## 2017-03-18 LAB — MICROALBUMIN / CREATININE URINE RATIO
CREATININE, URINE: 175 mg/dL (ref 20–320)
MICROALB UR: 0.3 mg/dL
Microalb Creat Ratio: 2 mcg/mg creat (ref <30)

## 2017-03-18 LAB — CK TOTAL AND CKMB (NOT AT ARMC)
CK, MB: <0.7 ng/mL (ref 0–5.0)
Total CK: 81 U/L (ref 44–196)

## 2017-03-18 LAB — MAGNESIUM: MAGNESIUM: 2 mg/dL (ref 1.5–2.5)

## 2017-03-18 LAB — VITAMIN B12: VITAMIN B 12: 487 pg/mL (ref 200–1100)

## 2017-03-18 LAB — TROPONIN I: TROPONIN I: 0.02 ng/mL (ref <=0.0)

## 2017-03-18 LAB — VITAMIN D 25 HYDROXY (VIT D DEFICIENCY, FRACTURES): Vit D, 25-Hydroxy: 25 ng/mL — ABNORMAL LOW (ref 30–100)

## 2017-03-18 LAB — HIV ANTIBODY (ROUTINE TESTING W REFLEX): HIV: NONREACTIVE

## 2017-03-18 MED ORDER — ATORVASTATIN CALCIUM 20 MG PO TABS: 20.0000 mg | ORAL_TABLET | Freq: Every day | ORAL | 2 refills | Status: DC

## 2017-03-25 NOTE — Progress Notes (Signed)
Pt aware of lab results & voiced understanding of those results.

## 2017-04-01 ENCOUNTER — Ambulatory Visit (INDEPENDENT_AMBULATORY_CARE_PROVIDER_SITE_OTHER): Payer: 59 | Admitting: Physician Assistant

## 2017-04-01 ENCOUNTER — Encounter: Payer: Self-pay | Admitting: Physician Assistant

## 2017-04-01 VITALS — BP 120/88 | HR 66 | Ht 71.0 in | Wt 193.0 lb

## 2017-04-01 DIAGNOSIS — R079 Chest pain, unspecified: Secondary | ICD-10-CM

## 2017-04-01 DIAGNOSIS — E785 Hyperlipidemia, unspecified: Secondary | ICD-10-CM

## 2017-04-01 LAB — TROPONIN I: Troponin I: <0.01 ng/mL (ref 0.00–0.04)

## 2017-04-01 NOTE — Progress Notes (Signed)
Cardiology Office Note    Date:  04/01/2017   ID:  [name] Sarli, Park MeoDOB 1976-01-10, MRN [id number]  PCP:  Lucky CowboyMcKeown, William, MD  Cardiologist:  New - case discussed with DOD Dr. SwazilandJordan  Chief Complaint  Patient presents with  . New Patient (Initial Visit)    consult for chest pain, case discussed with DOD Dr. SwazilandJordan  . Shortness of Breath  . Chest Pain    History of Present Illness:  [name] [name] is a 41 y.o. male with PMH of HLD who is referred here for evaluation of CP. Recent lab work shows normal hemoglobin and normal TSH. For the past month, he has been having worsening fatigue, exertional shortness breath and intermittent chest discomfort. His chest discomfort does not necessarily occur with exertion. He does Holiday representativeconstruction jobs. He often notices chest discomfort lasting hours at a time. He says it usually occurs at rest. In fact he started having chest discomfort around 2-3 AM this morning and it has been persistently sore. He denies any exacerbating or alleviating factors. His maternal grandfather and maternal uncle had heart issues later in life, however there is no family history of early CAD. He did smoke for 2 years when he was young and did not pick up since. I have discussed his case with DOD Dr. SwazilandJordan, given persistent chest discomfort, I will obtain a stat troponin to rule out active injury. There was no direct correlation with exertion, I am more worried about the increasing dyspnea with exertion than the chest discomfort. Given the atypical nature of his symptom, I opted for a GXT for further evaluation. If GXT and trop is negative, he can follow-up with cardiology on an as-needed basis.    Past Medical History:  Diagnosis Date  . Hyperlipidemia 03/18/2017    Past Surgical History:  Procedure Laterality Date  . SHOULDER SURGERY Left 2013   reconstruction after accident thrown off horse    Current Medications: Outpatient Medications Prior to Visit  Medication Sig  Dispense Refill  . atorvastatin (LIPITOR) 20 MG tablet Take 1 tablet (20 mg total) by mouth at bedtime. 30 tablet 2   No facility-administered medications prior to visit.      Allergies:   Patient has no known allergies.   Social History   Socioeconomic History  . Marital status: Married    Spouse name: None  . Number of children: None  . Years of education: None  . Highest education level: None  Social Needs  . Financial resource strain: None  . Food insecurity - worry: None  . Food insecurity - inability: None  . Transportation needs - medical: None  . Transportation needs - non-medical: None  Occupational History  . None  Tobacco Use  . Smoking status: Former Smoker    Packs/day: 1.00    Years: 4.00    Pack years: 4.00    Types: Cigarettes    Last attempt to quit: 03/17/2002    Years since quitting: 15.0  . Smokeless tobacco: Never Used  Substance and Sexual Activity  . Alcohol use: Yes    Alcohol/week: 1.2 oz    Types: 2 Cans of beer per week    Comment: 2-3 beers a week  . Drug use: No  . Sexual activity: Yes  Other Topics Concern  . None  Social History Narrative  . None     Family History:  The patient's family history includes Cancer in his maternal grandmother; Depression in his mother; Heart disease in his  maternal grandfather; Heart disease (age of onset: 8267) in his maternal uncle; Hypertension in his mother; Migraines in his mother.   ROS:   Please see the history of present illness.    ROS All other systems reviewed and are negative.   PHYSICAL EXAM:   VS:  BP 120/88   Pulse 66   Ht 5\' 11"  (1.803 m)   Wt 193 lb (87.5 kg)   BMI 26.92 kg/m    GEN: Well nourished, well developed, in no acute distress  HEENT: normal  Neck: no JVD, carotid bruits, or masses Cardiac: RRR; no murmurs, rubs, or gallops,no edema  Respiratory:  clear to auscultation bilaterally, normal work of breathing GI: soft, nontender, nondistended, + BS MS: no deformity or  atrophy  Skin: warm and dry, no rash Neuro:  Alert and Oriented x 3, Strength and sensation are intact Psych: euthymic mood, full affect  Wt Readings from Last 3 Encounters:  04/01/17 193 lb (87.5 kg)  03/17/17 195 lb 12.8 oz (88.8 kg)      Studies/Labs Reviewed:   EKG:  EKG is ordered today.  The ekg ordered today demonstrates normal sinus rhythm without significant ST-T wave changes  Recent Labs: 03/17/2017: ALT 37; BUN 14; Creat 1.03; Hemoglobin 17.0; Magnesium 2.0; Platelets 223; Potassium 4.6; Sodium 140; TSH 2.36   Lipid Panel    Component Value Date/Time   CHOL 261 (H) 03/17/2017 0947   TRIG 211 (H) 03/17/2017 0947   HDL 43 03/17/2017 0947   CHOLHDL 6.1 (H) 03/17/2017 0947    Additional studies/ records that were reviewed today include:   EKG obtained by primary care provider. Recent primary care provider evaluation.   ASSESSMENT:    1. Chest pain, unspecified type   2. Hyperlipidemia, unspecified hyperlipidemia type      PLAN:  In order of problems listed above:  1. Chest pain: symptom occurs with dyspnea on exertion which is concerning, however chest pain does not seem to have direct correlation with exertion. He is quite young. EKG is negative for ischemia. Hemoglobin and a TSH was normal recently. I recommended a troponin given active chest discomfort. I will also obtain a GXT as well. Case has been discussed with DOD Dr. SwazilandJordan who also agrees.  2. Hyperlipidemia: recently started on Lipitor, his LDL was greater than 180, likely will need up titration of Lipitor. However this can be done after repeating a fasting lipid panel and LFTs in 6-8 weeks.    Medication Adjustments/Labs and Tests Ordered: Current medicines are reviewed at length with the patient today.  Concerns regarding medicines are outlined above.  Medication changes, Labs and Tests ordered today are listed in the Patient Instructions below. Patient Instructions  Your physician recommends  that you return for lab work TODAY  Your physician has requested that you have an exercise tolerance test. For further information please visit [email or web address] Please also follow instruction sheet, as given.  Your physician recommends that you schedule a follow-up appointment with Dr. SwazilandJordan as needed. We will contact with the results of your tests.      Ramond DialSigned, Kyrstin Campillo, [place]  04/01/2017 1:22 PM    [facility] [address], [place], [place]  [address] Phone: [phone]; Fax: [phone]

## 2017-04-01 NOTE — Progress Notes (Signed)
Troponin normal. Continue plan with GXT. I did attempt to call patient with result, however got answer machine

## 2017-04-01 NOTE — Patient Instructions (Signed)
Your physician recommends that you return for lab work TODAY  Your physician has requested that you have an exercise tolerance test. For further information please visit [email or web address] Please also follow instruction sheet, as given.  Your physician recommends that you schedule a follow-up appointment with Dr. SwazilandJordan as needed. We will contact with the results of your tests.

## 2017-04-09 ENCOUNTER — Telehealth (HOSPITAL_COMMUNITY): Payer: Self-pay

## 2017-04-09 NOTE — Telephone Encounter (Signed)
Encounter complete. 

## 2017-04-16 ENCOUNTER — Ambulatory Visit (HOSPITAL_COMMUNITY)
Admission: RE | Admit: 2017-04-16 | Discharge: 2017-04-16 | Disposition: A | Discharge status: Home / Self Care | Payer: 59 | Source: Ambulatory Visit | Attending: Cardiology | Admitting: Cardiology

## 2017-04-16 DIAGNOSIS — R079 Chest pain, unspecified: Secondary | ICD-10-CM | POA: Diagnosis present

## 2017-04-16 LAB — EXERCISE TOLERANCE TEST
CHL CUP MPHR: 178 {beats}/min
CHL CUP RESTING HR STRESS: 60 {beats}/min
CHL RATE OF PERCEIVED EXERTION: 18
CSEPEDS: 27 s
CSEPHR: 96 %
Estimated workload: 14.2 METS
Exercise duration (min): 12 min
Peak HR: 171 {beats}/min

## 2017-05-01 ENCOUNTER — Ambulatory Visit: Payer: 59 | Admitting: Cardiology

## 2017-12-08 DIAGNOSIS — M8568 Other cyst of bone, other site: Secondary | ICD-10-CM | POA: Diagnosis not present

## 2017-12-08 DIAGNOSIS — M25531 Pain in right wrist: Secondary | ICD-10-CM | POA: Diagnosis not present

## 2017-12-08 DIAGNOSIS — M85641 Other cyst of bone, right hand: Secondary | ICD-10-CM | POA: Diagnosis not present

## 2017-12-08 DIAGNOSIS — M85441 Solitary bone cyst, right hand: Secondary | ICD-10-CM | POA: Diagnosis not present

## 2017-12-23 DIAGNOSIS — M85441 Solitary bone cyst, right hand: Secondary | ICD-10-CM | POA: Diagnosis not present

## 2017-12-23 DIAGNOSIS — M25531 Pain in right wrist: Secondary | ICD-10-CM | POA: Diagnosis not present

## 2018-03-18 NOTE — Progress Notes (Signed)
Complete Physical  Assessment and Plan:  BMI 27.0-27.9,adult Monitor weight, increase veggies  Needs flu shot -     FLU VACCINE MDCK QUAD W/Preservative  MRSA colonization Will monitor, had treatment recently, may need to treat family  Medication management -     CBC with Differential/Platelet -     BASIC METABOLIC PANEL WITH GFR -     Hepatic function panel -     Magnesium  Vitamin D deficiency -     VITAMIN D 25 Hydroxy (Vit-D Deficiency, Fractures)  Encounter for general adult medical examination with abnormal findings  Gastroesophageal reflux disease without esophagitis Continue PPI/H2 blocker, diet discussed  Anxiety -     TSH - declines meds at this time, will check labs  Hyperlipidimia -     Lipid panel Get back on lipitor, recheck chol 3 months decrease fatty foods increase activity.   Screening for blood or protein in urine -     Urinalysis, Routine w reflex microscopic -     Microalbumin / creatinine urine ratio  Screening for blood or protein in urine -     Urinalysis, Routine w reflex microscopic -     Microalbumin / creatinine urine ratio  Plantar fasciitis -     meloxicam (MOBIC) 15 MG tablet; Take one daily with food for 2 weeks, can take with tylenol, can not take with aleve, iburpofen, then as needed daily for pain Conservative treatment, night time orthotics, arch support, RICE, NSAID, stretches given If not better will do injection of dexamethasone    Discussed med's effects and SE's. Screening labs and tests as requested with regular follow-up as recommended. Over 40 minutes of exam, counseling, chart review and critical decision making was performed  HPI Patient presents for a complete physical.   His blood pressure has been controlled at home, today their BP is BP: 132/90 He does not workout, but very physical job. He denies chest pain, shortness of breath, dizziness.   Has had a lot of stress, has own business Product/process development scientist, he  has trouble staying asleep, a lot of anxiety. He is fine falling asleep but states that he has a hard time staying asleep.   Had colonoscopy 2014 in [place].  Has horses at their house, wife is [place] Has daughter 91 and son 63.    His blood pressure has been controlled at home, today their BP is BP: 132/90  He does not workout. He denies chest pain, shortness of breath, dizziness. BMI is Body mass index is 27.36 kg/m., he is working on diet and exercise. Wt Readings from Last 3 Encounters:  03/19/18 196 lb 3.2 oz (89 kg)  04/01/17 193 lb (87.5 kg)  03/17/17 195 lb 12.8 oz (88.8 kg)    He had a negative stress test 03/2017 with Dr. [place].   He is on cholesterol medication and denies myalgias. His cholesterol is not at goal. The cholesterol last visit was:   Lab Results  Component Value Date   CHOL 261 (H) 03/17/2017   HDL 43 03/17/2017   LDLCALC 180 (H) 03/17/2017   TRIG 211 (H) 03/17/2017   CHOLHDL 6.1 (H) 03/17/2017   Patient is on Vitamin D supplement.   Lab Results  Component Value Date   VD25OH 25 (L) 03/17/2017      Current Medications:  No current outpatient medications on file prior to visit.   No current facility-administered medications on file prior to visit.    Allergies:  No Known Allergies  Health Maintenance:  Immunization History  Administered Date(s) Administered  . Influenza Inj Mdck Quad With Preservative 03/17/2017, 03/19/2018  . Tdap 03/17/2017    Tetanus: 2018 Pneumovax: n/a Prevnar 13: n/a Flu vaccine: TODAY Zostavax:n/a  DEXA: Colonoscopy: had 2014, will get exact date EGD: STRESS TEST 03/2017  Patient Care Team: [name], MD as PCP - General (Internal Medicine)  Medical History:  has MRSA colonization; BMI 27.0-27.9,adult; and Hyperlipidemia on their problem list. Surgical History:  He  has a past surgical history that includes Shoulder surgery (Left, 2013). Family History:  His family history includes Cancer in  his maternal grandmother; Depression in his mother; Heart disease in his maternal grandfather; Heart disease (age of onset: 55) in his maternal uncle; Hypertension in his mother; Migraines in his mother. Social History:   reports that he quit smoking about 16 years ago. His smoking use included cigarettes. He has a 4.00 pack-year smoking history. He has never used smokeless tobacco. He reports that he drinks about 2.0 standard drinks of alcohol per week. He reports that he does not use drugs.   Review of Systems:  Review of Systems  Constitutional: Negative for chills, diaphoresis, fever, malaise/fatigue and weight loss.  HENT: Negative.   Eyes: Negative.   Respiratory: Negative for cough, hemoptysis, sputum production, shortness of breath and wheezing.   Cardiovascular: Negative for chest pain, palpitations, orthopnea, claudication, leg swelling and PND.  Gastrointestinal: Negative for abdominal pain, blood in stool, constipation, diarrhea, heartburn, melena, nausea and vomiting.  Genitourinary: Negative.   Musculoskeletal: Positive for joint pain.  Skin: Negative.  Negative for rash.  Neurological: Negative for weakness.  Psychiatric/Behavioral: The patient is nervous/anxious and has insomnia.     Physical Exam: Estimated body mass index is 27.36 kg/m as calculated from the following:   Height as of this encounter: 5\' 11"  (1.803 m).   Weight as of this encounter: 196 lb 3.2 oz (89 kg). BP 132/90   Pulse 66   Temp 98.2 F (36.8 C)   Resp 16   Ht 5\' 11"  (1.803 m)   Wt 196 lb 3.2 oz (89 kg)   SpO2 99%   BMI 27.36 kg/m  General Appearance: Well nourished, in no apparent distress.  Eyes: PERRLA, EOMs, conjunctiva no swelling or erythema, normal fundi and vessels.  Sinuses: No Frontal/maxillary tenderness  ENT/Mouth: Ext aud canals clear, normal light reflex with TMs without erythema, bulging. Good dentition. No erythema, swelling, or exudate on post pharynx. Tonsils not swollen or  erythematous. Hearing normal.  Neck: Supple, thyroid normal. No bruits  Respiratory: Respiratory effort normal, BS equal bilaterally without rales, rhonchi, wheezing or stridor.  Cardio: RRR without murmurs, rubs or gallops. Brisk peripheral pulses without edema.  Chest: symmetric, with normal excursions and percussion.  Abdomen: Soft, nontender, no guarding, rebound, hernias, masses, or organomegaly.  Lymphatics: Non tender without lymphadenopathy.  Genitourinary: defer Musculoskeletal: Full ROM all peripheral extremities,5/5 strength, and normal gait.  Skin: Warm, dry without rashes, lesions, ecchymosis. Neuro: Cranial nerves intact, reflexes equal bilaterally. Normal muscle tone, no cerebellar symptoms. Sensation intact.  Psych: Awake and oriented X 3, normal affect, Insight and Judgment appropriate.   EKG: WNL no ST changes. AORTA SCAN: defer  Quentin Mulling 9:12 AM [facility]

## 2018-03-19 ENCOUNTER — Ambulatory Visit (INDEPENDENT_AMBULATORY_CARE_PROVIDER_SITE_OTHER): Payer: 59 | Admitting: Physician Assistant

## 2018-03-19 ENCOUNTER — Encounter: Payer: Self-pay | Admitting: Physician Assistant

## 2018-03-19 VITALS — BP 132/90 | HR 66 | Temp 98.2°F | Resp 16 | Ht 71.0 in | Wt 196.2 lb

## 2018-03-19 DIAGNOSIS — Z1389 Encounter for screening for other disorder: Secondary | ICD-10-CM

## 2018-03-19 DIAGNOSIS — Z0000 Encounter for general adult medical examination without abnormal findings: Secondary | ICD-10-CM

## 2018-03-19 DIAGNOSIS — Z23 Encounter for immunization: Secondary | ICD-10-CM

## 2018-03-19 DIAGNOSIS — E785 Hyperlipidemia, unspecified: Secondary | ICD-10-CM

## 2018-03-19 DIAGNOSIS — Z22322 Carrier or suspected carrier of Methicillin resistant Staphylococcus aureus: Secondary | ICD-10-CM

## 2018-03-19 DIAGNOSIS — Z79899 Other long term (current) drug therapy: Secondary | ICD-10-CM | POA: Diagnosis not present

## 2018-03-19 DIAGNOSIS — E559 Vitamin D deficiency, unspecified: Secondary | ICD-10-CM

## 2018-03-19 DIAGNOSIS — Z130 Encounter for screening for diseases of the blood and blood-forming organs and certain disorders involving the immune mechanism: Secondary | ICD-10-CM

## 2018-03-19 DIAGNOSIS — Z0001 Encounter for general adult medical examination with abnormal findings: Secondary | ICD-10-CM

## 2018-03-19 DIAGNOSIS — Z6827 Body mass index (BMI) 27.0-27.9, adult: Secondary | ICD-10-CM

## 2018-03-19 DIAGNOSIS — M722 Plantar fascial fibromatosis: Secondary | ICD-10-CM

## 2018-03-19 MED ORDER — ATORVASTATIN CALCIUM 20 MG PO TABS: 20.0000 mg | ORAL_TABLET | Freq: Every day | ORAL | 1 refills | Status: DC

## 2018-03-19 MED ORDER — MELOXICAM 15 MG PO TABS: ORAL_TABLET | ORAL | 1 refills | Status: DC

## 2018-03-19 NOTE — Patient Instructions (Addendum)
Try the melatonin 5mg -20mg  dissolvable or gummy 30 mins before bed If this does not help we can try one of 3 things A very low dose of xanax  When you wake up AS NEEDED Trazodone as needed Or zoloft as needed  11 Tips to Follow:  1. No caffeine after 3pm: Avoid beverages with caffeine (soda, tea, energy drinks, etc.) especially after 3pm. 2. Don't go to bed hungry: Have your evening meal at least 3 hrs. before going to sleep. It's fine to have a small bedtime snack such as a glass of milk and a few crackers but don't have a big meal. 3. Have a nightly routine before bed: Plan on "winding down" before you go to sleep. Begin relaxing about 1 hour before you go to bed. Try doing a quiet activity such as listening to calming music, reading a book or meditating. 4. Turn off the TV and ALL electronics including video games, tablets, laptops, etc. 1 hour before sleep, and keep them out of the bedroom. 5. Turn off your cell phone and all notifications (new email and text alerts) or even better, leave your phone outside your room while you sleep. Studies have shown that a part of your brain continues to respond to certain lights and sounds even while you're still asleep. 6. Make your bedroom quiet, dark and cool. If you can't control the noise, try wearing earplugs or using a fan to block out other sounds. 7. Practice relaxation techniques. Try reading a book or meditating or drain your brain by writing a list of what you need to do the next day. 8. Don't nap unless you feel sick: you'll have a better night's sleep. 9. Don't smoke, or quit if you do. Nicotine, alcohol, and marijuana can all keep you awake. Talk to your health care provider if you need help with substance use. 10. Most importantly, wake up at the same time every day (or within 1 hour of your usual wake up time) EVEN on the weekends. A regular wake up time promotes sleep hygiene and prevents sleep problems. 11. Reduce exposure to bright light  in the last three hours of the day before going to sleep. Maintaining good sleep hygiene and having good sleep habits lower your risk of developing sleep problems. Getting better sleep can also improve your concentration and alertness. Try the simple steps in this guide. If you still have trouble getting enough rest, make an appointment with your health care provider.  Your LDL could improve, ideally we want it under a 100.  Your LDL is the bad cholesterol that can lead to heart attack and stroke. To lower your number you can decrease your fatty foods, red meat, cheese, milk and increase fiber like whole grains and veggies. You can also add a fiber supplement like Citracel or Benefiber, these do not cause gas and bloating and are safe to use. Especially if you have a strong family history of heart disease or stroke or you have evidence of plaque on any imaging like a chest xray, we may discuss at your next office visit putting you on a medication to get your number below 100.   Statin therapy  In addition to the known lipid-lowering effects, statins are now widely accepted to have anti-inflammatory and immunomodulatory effects. Adjunctive use of statins has proven beneficial in the context of a wide range of inflammatory diseases, including rheumatoid arthritis.  Research has shown that the salutary effect of statins on cholesterol may not be their  only benefit. Statin therapy has shown promise for everything from fighting viral infections to protecting the eye from cataracts.  A 2005 study of more than 700 hospital patients being treated for pneumonia found that the death rate was more than twice as high among those who were not using statins. In 2006, a [place] study examined the rate of sepsis, a deadly blood infection, among patients who had been hospitalized for heart events. In the two years after their hospitalization, the statin users had a rate of sepsis 19% lower than that of the non-statin  users. A 2009 review of 22 studies found that statins appeared to have a beneficial effect on the outcome of infection, but they couldn't come to a firm conclusion.  WATER IS IMPORTANT  Being dehydrated can hurt your kidneys, cause fatigue, headaches, muscle aches, joint pain, and dry skin/nails so please increase your fluids.   Drink 80-100 oz a day of water, measure it out! Eat 3 meals a day, have to do breakfast, eat protein- hard boiled eggs, protein bar like nature valley protein bar, greek yogurt like oikos triple zero, chobani 100, or light n fit greek  Can check out plantnanny app on your phone to help you keep track of your water       When it comes to diets, agreement about the perfect plan isn't easy to find, even among the experts. Experts at the [facility] [employer] developed an idea known as the Healthy Eating Plate. Just imagine a plate divided into logical, healthy portions.  The emphasis is on diet quality:  Load up on vegetables and fruits - one-half of your plate: Aim for color and variety, and remember that potatoes don't count.  Go for whole grains - one-quarter of your plate: Whole wheat, barley, wheat berries, quinoa, oats, brown rice, and foods made with them. If you want pasta, go with whole wheat pasta.  Protein power - one-quarter of your plate: Fish, chicken, beans, and nuts are all healthy, versatile protein sources. Limit red meat.  The diet, however, does go beyond the plate, offering a few other suggestions.  Use healthy plant oils, such as olive, canola, soy, corn, sunflower and peanut. Check the labels, and avoid partially hydrogenated oil, which have unhealthy trans fats.  If you're thirsty, drink water. Coffee and tea are good in moderation, but skip sugary drinks and limit milk and dairy products to one or two daily servings.  The type of carbohydrate in the diet is more important than the amount. Some sources of carbohydrates, such as  vegetables, fruits, whole grains, and beans-are healthier than others.  Finally, stay active.   Do water bottle frozen and roll your foot on it Get inserts for your plantar fasciitis Do stretches at night If it is not better in 6-8 weeks we can do an injection  Mobic is an antiinflammatory It helps pain, can not take with aleve, or ibuprofen You can take tylenol (500mg ) or tylenol arthritis (650mg ) with the meloxicam/antiinflammatories. The max you can take of tylenol a day is 3000mg  daily, this is a max of 6 pills a day of the regular tyelnol (500mg ) or a max of 4 a day of the tylenol arthritis (650mg ) as long as no other medications you are taking contain tylenol.   Mobic can cause inflammation in your stomach and can cause ulcers or bleeding, this will look like black tarry stools Make sure you take your mobic with food Try not to take it  daily, take AS needed Can take with zantac    Plantar Fasciitis  Plantar fasciitis is a painful foot condition that affects the heel. It occurs when the band of tissue that connects the toes to the heel bone (plantar fascia) becomes irritated. This can happen after exercising too much or doing other repetitive activities (overuse injury). The pain from plantar fasciitis can range from mild irritation to severe pain that makes it difficult for you to walk or move. The pain is usually worse in the morning or after you have been sitting or lying down for a while. What are the causes? This condition may be caused by:  Standing for long periods of time.  Wearing shoes that do not fit.  Doing high-impact activities, including running, aerobics, and ballet.  Being overweight.  Having an abnormal way of walking (gait).  Having tight calf muscles.  Having high arches in your feet.  Starting a new athletic activity.  What are the signs or symptoms? The main symptom of this condition is heel pain. Other symptoms include:  Pain that gets worse  after activity or exercise.  Pain that is worse in the morning or after resting.  Pain that goes away after you walk for a few minutes.  How is this diagnosed? This condition may be diagnosed based on your signs and symptoms. Your health care provider will also do a physical exam to check for:  A tender area on the bottom of your foot.  A high arch in your foot.  Pain when you move your foot.  Difficulty moving your foot.  You may also need to have imaging studies to confirm the diagnosis. These can include:  X-rays.  Ultrasound.  MRI.  How is this treated? Treatment for plantar fasciitis depends on the severity of the condition. Your treatment may include:  Rest, ice, and over-the-counter pain medicines to manage your pain.  Exercises to stretch your calves and your plantar fascia.  A splint that holds your foot in a stretched, upward position while you sleep (night splint).  Physical therapy to relieve symptoms and prevent problems in the future.  Cortisone injections to relieve severe pain.  Extracorporeal shock wave therapy (ESWT) to stimulate damaged plantar fascia with electrical impulses. It is often used as a last resort before surgery.  Surgery, if other treatments have not worked after 12 months.  Follow these instructions at home:  Take medicines only as directed by your health care provider.  Avoid activities that cause pain.  Roll the bottom of your foot over a bag of ice or a bottle of cold water. Do this for 20 minutes, 3-4 times a day.  Perform simple stretches as directed by your health care provider.  Try wearing athletic shoes with air-sole or gel-sole cushions or soft shoe inserts.  Wear a night splint while sleeping, if directed by your health care provider.  Keep all follow-up appointments with your health care provider. How is this prevented?  Do not perform exercises or activities that cause heel pain.  Consider finding low-impact  activities if you continue to have problems.  Lose weight if you need to. The best way to prevent plantar fasciitis is to avoid the activities that aggravate your plantar fascia. Contact a health care provider if:  Your symptoms do not go away after treatment with home care measures.  Your pain gets worse.  Your pain affects your ability to move or do your daily activities. This information is not intended to  replace advice given to you by your health care provider. Make sure you discuss any questions you have with your health care provider. Document Released: 01/29/2001 Document Revised: 10/09/2015 Document Reviewed: 03/16/2014 Elsevier Interactive Patient Education  [employer].

## 2018-03-20 LAB — CBC WITH DIFFERENTIAL/PLATELET
BASOS ABS: 18 {cells}/uL (ref 0–200)
Basophils Relative: 0.4 %
EOS ABS: 81 {cells}/uL (ref 15–500)
Eosinophils Relative: 1.8 %
HEMATOCRIT: 48.4 % (ref 38.5–50.0)
Hemoglobin: 16.5 g/dL (ref 13.2–17.1)
LYMPHS ABS: 1634 {cells}/uL (ref 850–3900)
MCH: 29.6 pg (ref 27.0–33.0)
MCHC: 34.1 g/dL (ref 32.0–36.0)
MCV: 86.9 fL (ref 80.0–100.0)
MPV: 11.9 fL (ref 7.5–12.5)
Monocytes Relative: 9.4 %
NEUTROS PCT: 52.1 %
Neutro Abs: 2345 cells/uL (ref 1500–7800)
Platelets: 192 10*3/uL (ref 140–400)
RBC: 5.57 10*6/uL (ref 4.20–5.80)
RDW: 12.7 % (ref 11.0–15.0)
Total Lymphocyte: 36.3 %
WBC mixed population: 423 cells/uL (ref 200–950)
WBC: 4.5 10*3/uL (ref 3.8–10.8)

## 2018-03-20 LAB — LIPID PANEL
CHOL/HDL RATIO: 6.2 (calc) — AB (ref <5.0)
Cholesterol: 292 mg/dL — ABNORMAL HIGH (ref <200)
HDL: 47 mg/dL (ref >40)
LDL Cholesterol (Calc): 203 mg/dL (calc) — ABNORMAL HIGH
NON-HDL CHOLESTEROL (CALC): 245 mg/dL — AB (ref <130)
TRIGLYCERIDES: 229 mg/dL — AB (ref <150)

## 2018-03-20 LAB — URINALYSIS, ROUTINE W REFLEX MICROSCOPIC
BILIRUBIN URINE: NEGATIVE
Bacteria, UA: NONE SEEN /HPF
GLUCOSE, UA: NEGATIVE
Hgb urine dipstick: NEGATIVE
Hyaline Cast: NONE SEEN /LPF
Ketones, ur: NEGATIVE
NITRITE: NEGATIVE
PH: 8 (ref 5.0–8.0)
Protein, ur: NEGATIVE
RBC / HPF: NONE SEEN /HPF (ref 0–2)
SPECIFIC GRAVITY, URINE: 1.011 (ref 1.001–1.03)
Squamous Epithelial / LPF: NONE SEEN /HPF (ref <=5)
WBC, UA: NONE SEEN /HPF (ref 0–5)

## 2018-03-20 LAB — COMPLETE METABOLIC PANEL WITH GFR
AG RATIO: 1.6 (calc) (ref 1.0–2.5)
ALT: 53 U/L — ABNORMAL HIGH (ref 9–46)
AST: 31 U/L (ref 10–40)
Albumin: 4.7 g/dL (ref 3.6–5.1)
Alkaline phosphatase (APISO): 60 U/L (ref 40–115)
BUN: 14 mg/dL (ref 7–25)
CALCIUM: 10 mg/dL (ref 8.6–10.3)
CO2: 33 mmol/L — ABNORMAL HIGH (ref 20–32)
Chloride: 101 mmol/L (ref 98–110)
Creat: 1.05 mg/dL (ref 0.60–1.35)
GFR, EST AFRICAN AMERICAN: 100 mL/min/{1.73_m2} (ref >=60)
GFR, EST NON AFRICAN AMERICAN: 87 mL/min/{1.73_m2} (ref >=60)
Globulin: 3 g/dL (calc) (ref 1.9–3.7)
Glucose, Bld: 90 mg/dL (ref 65–99)
Potassium: 5 mmol/L (ref 3.5–5.3)
SODIUM: 140 mmol/L (ref 135–146)
Total Bilirubin: 0.6 mg/dL (ref 0.2–1.2)
Total Protein: 7.7 g/dL (ref 6.1–8.1)

## 2018-03-20 LAB — MICROALBUMIN / CREATININE URINE RATIO
Creatinine, Urine: 65 mg/dL (ref 20–320)
Microalb, Ur: <0.2 mg/dL

## 2018-03-20 LAB — TSH: TSH: 2 mIU/L (ref 0.40–4.50)

## 2018-03-20 LAB — VITAMIN D 25 HYDROXY (VIT D DEFICIENCY, FRACTURES): VIT D 25 HYDROXY: 25 ng/mL — AB (ref 30–100)

## 2018-03-20 LAB — MAGNESIUM: Magnesium: 2 mg/dL (ref 1.5–2.5)

## 2018-06-25 NOTE — Progress Notes (Deleted)
FOLLOW UP  Assessment and Plan:   Hypertension -Continue medication, monitor blood pressure at home. Continue DASH diet.  Reminder to go to the ER if any CP, SOB, nausea, dizziness, severe HA, changes vision/speech, left arm numbness and tingling and jaw pain.  Cholesterol -Continue diet and exercise. Check cholesterol.    Prediabetes  -Continue diet and exercise. Check A1C  Vitamin D Def - check level and continue medications.   Continue diet and meds as discussed. Further disposition pending results of labs. Over 30 minutes of exam, counseling, chart review, and critical decision making was performed  Future Appointments  Date Time Provider Department Center  06/29/2018  9:30 AM Quentin Mulling, PA-C [facility] None  03/24/2019  9:00 AM Quentin Mulling, PA-C [facility] None     HPI 43 y.o. male  presents for 3 month follow up on hypertension, cholesterol, prediabetes, and vitamin D deficiency.   His blood pressure {HAS HAS NOT:18834} been controlled at home, today their BP is     He {DOES_DOES FYB:01751} workout. He denies chest pain, shortness of breath, dizziness.   He  {ACTION; IS/IS WCH:85277824}  on cholesterol medication and denies myalgias. His cholesterol {ACTION; IS/IS NOT:21021397} at goal. The cholesterol last visit was:   Lab Results  Component Value Date   CHOL 292 (H) 03/19/2018   HDL 47 03/19/2018   LDLCALC 203 (H) 03/19/2018   TRIG 229 (H) 03/19/2018   CHOLHDL 6.2 (H) 03/19/2018    He {Has/has not:18111} been working on diet and exercise for prediabetes, and denies {Symptoms; diabetes w/o none:19199}. Last A1C in the office was:  Lab Results  Component Value Date   HGBA1C 5.3 03/17/2017    Patient is on Vitamin D supplement.   Lab Results  Component Value Date   VD25OH 25 (L) 03/19/2018     BMI is There is no height or weight on file to calculate BMI., he is working on diet and exercise. Wt Readings from Last 3 Encounters:  03/19/18 196 lb 3.2 oz  (89 kg)  04/01/17 193 lb (87.5 kg)  03/17/17 195 lb 12.8 oz (88.8 kg)     Current Medications:  Current Outpatient Medications on File Prior to Visit  Medication Sig  . atorvastatin (LIPITOR) 20 MG tablet Take 1 tablet (20 mg total) by mouth at bedtime.  . meloxicam (MOBIC) 15 MG tablet Take one daily with food for 2 weeks, can take with tylenol, can not take with aleve, iburpofen, then as needed daily for pain   No current facility-administered medications on file prior to visit.     Medical History:  Past Medical History:  Diagnosis Date  . Hyperlipidemia 03/18/2017   Allergies: No Known Allergies   Review of Systems:  ROS  Family history- Review and unchanged Social history- Review and unchanged Physical Exam: There were no vitals taken for this visit. Wt Readings from Last 3 Encounters:  03/19/18 196 lb 3.2 oz (89 kg)  04/01/17 193 lb (87.5 kg)  03/17/17 195 lb 12.8 oz (88.8 kg)   General Appearance: Well nourished, in no apparent distress. Eyes: PERRLA, EOMs, conjunctiva no swelling or erythema Sinuses: No Frontal/maxillary tenderness ENT/Mouth: Ext aud canals clear, TMs without erythema, bulging. No erythema, swelling, or exudate on post pharynx.  Tonsils not swollen or erythematous. Hearing normal.  Neck: Supple, thyroid normal.  Respiratory: Respiratory effort normal, BS equal bilaterally without rales, rhonchi, wheezing or stridor.  Cardio: RRR with no MRGs. Brisk peripheral pulses without edema.  Abdomen: Soft, + BS,  Non tender, no guarding, rebound, hernias, masses. Lymphatics: Non tender without lymphadenopathy.  Musculoskeletal: Full ROM, 5/5 strength, {PSY - GAIT AND STATION:22860} gait Skin: Warm, dry without rashes, lesions, ecchymosis.  Neuro: Cranial nerves intact. Normal muscle tone, no cerebellar symptoms. Psych: Awake and oriented X 3, normal affect, Insight and Judgment appropriate.    Quentin MullingAmanda Dayleen Beske, PA-C 1:47 PM [facility]

## 2018-06-29 ENCOUNTER — Ambulatory Visit: Payer: Self-pay | Admitting: Physician Assistant

## 2018-07-07 NOTE — Progress Notes (Deleted)
FOLLOW UP  Assessment and Plan:   Hypertension -Continue medication, monitor blood pressure at home. Continue DASH diet.  Reminder to go to the ER if any CP, SOB, nausea, dizziness, severe HA, changes vision/speech, left arm numbness and tingling and jaw pain.  Cholesterol -Continue diet and exercise. Check cholesterol.    Prediabetes  -Continue diet and exercise. Check A1C  Vitamin D Def - check level and continue medications.   Continue diet and meds as discussed. Further disposition pending results of labs. Over 30 minutes of exam, counseling, chart review, and critical decision making was performed  Future Appointments  Date Time Provider Department Center  07/09/2018  3:45 PM Quentin Mulling, PA-C [facility] None  03/24/2019  9:00 AM Quentin Mulling, PA-C [facility] None     HPI 43 y.o. male  presents for 3 month follow up on hypertension, cholesterol, prediabetes, and vitamin D deficiency.   His blood pressure {HAS HAS NOT:18834} been controlled at home, today their BP is     He {DOES_DOES IEP:32951} workout. He denies chest pain, shortness of breath, dizziness.   He  {ACTION; IS/IS OAC:16606301}  on cholesterol medication and denies myalgias. His cholesterol {ACTION; IS/IS NOT:21021397} at goal. The cholesterol last visit was:   Lab Results  Component Value Date   CHOL 292 (H) 03/19/2018   HDL 47 03/19/2018   LDLCALC 203 (H) 03/19/2018   TRIG 229 (H) 03/19/2018   CHOLHDL 6.2 (H) 03/19/2018    He {Has/has not:18111} been working on diet and exercise for prediabetes, and denies {Symptoms; diabetes w/o none:19199}. Last A1C in the office was:  Lab Results  Component Value Date   HGBA1C 5.3 03/17/2017    Patient is on Vitamin D supplement.   Lab Results  Component Value Date   VD25OH 25 (L) 03/19/2018     BMI is There is no height or weight on file to calculate BMI., he is working on diet and exercise. Wt Readings from Last 3 Encounters:  03/19/18 196 lb 3.2 oz  (89 kg)  04/01/17 193 lb (87.5 kg)  03/17/17 195 lb 12.8 oz (88.8 kg)     Current Medications:  Current Outpatient Medications on File Prior to Visit  Medication Sig  . atorvastatin (LIPITOR) 20 MG tablet Take 1 tablet (20 mg total) by mouth at bedtime.  . meloxicam (MOBIC) 15 MG tablet Take one daily with food for 2 weeks, can take with tylenol, can not take with aleve, iburpofen, then as needed daily for pain   No current facility-administered medications on file prior to visit.     Medical History:  Past Medical History:  Diagnosis Date  . Hyperlipidemia 03/18/2017   Allergies: No Known Allergies   Review of Systems:  ROS  Family history- Review and unchanged Social history- Review and unchanged Physical Exam: There were no vitals taken for this visit. Wt Readings from Last 3 Encounters:  03/19/18 196 lb 3.2 oz (89 kg)  04/01/17 193 lb (87.5 kg)  03/17/17 195 lb 12.8 oz (88.8 kg)   General Appearance: Well nourished, in no apparent distress. Eyes: PERRLA, EOMs, conjunctiva no swelling or erythema Sinuses: No Frontal/maxillary tenderness ENT/Mouth: Ext aud canals clear, TMs without erythema, bulging. No erythema, swelling, or exudate on post pharynx.  Tonsils not swollen or erythematous. Hearing normal.  Neck: Supple, thyroid normal.  Respiratory: Respiratory effort normal, BS equal bilaterally without rales, rhonchi, wheezing or stridor.  Cardio: RRR with no MRGs. Brisk peripheral pulses without edema.  Abdomen: Soft, + BS,  Non tender, no guarding, rebound, hernias, masses. Lymphatics: Non tender without lymphadenopathy.  Musculoskeletal: Full ROM, 5/5 strength, {PSY - GAIT AND STATION:22860} gait Skin: Warm, dry without rashes, lesions, ecchymosis.  Neuro: Cranial nerves intact. Normal muscle tone, no cerebellar symptoms. Psych: Awake and oriented X 3, normal affect, Insight and Judgment appropriate.    Quentin Mulling, PA-C 1:25 PM [facility]

## 2018-07-09 ENCOUNTER — Ambulatory Visit: Payer: Self-pay | Admitting: Physician Assistant

## 2018-08-04 NOTE — Progress Notes (Signed)
FOLLOW UP  Assessment and Plan:   Hypertension -Continue medication, monitor blood pressure at home. Continue DASH diet.  Reminder to go to the ER if any CP, SOB, nausea, dizziness, severe HA, changes vision/speech, left arm numbness and tingling and jaw pain.  Cholesterol -Continue diet and exercise. Check cholesterol.   Plantar Faciitis-  Conservative treatment, night time orthotics, arch support, RICE, NSAID, stretches given If not better will do injection of dexamethasone  Vitamin D Def - check level and continue medications.   Continue diet and meds as discussed. Further disposition pending results of labs. Over 30 minutes of exam, counseling, chart review, and critical decision making was performed  Future Appointments  Date Time Provider Department Center  03/24/2019  9:00 AM Quentin Mulling, PA-C [facility] None     HPI 43 y.o. male  presents for 3 month follow up on hypertension, cholesterol, prediabetes, and vitamin D deficiency.   He was treated conservatively at Oct visit for plantar fasciitis, he is still doing stretches, arch support in his work shoes.   His blood pressure has been controlled at home, today their BP is BP: 124/82   He does not workout, active work. He denies chest pain, shortness of breath, dizziness.   He  is  on cholesterol medication, is on lipitor 20mg  started last visit and denies myalgias. His cholesterol is not at goal. The cholesterol last visit was:   Lab Results  Component Value Date   CHOL 292 (H) 03/19/2018   HDL 47 03/19/2018   LDLCALC 203 (H) 03/19/2018   TRIG 229 (H) 03/19/2018   CHOLHDL 6.2 (H) 03/19/2018   Patient is on Vitamin D supplement.   Lab Results  Component Value Date   VD25OH 25 (L) 03/19/2018     BMI is Body mass index is 27.89 kg/m., he is working on diet and exercise. Wt Readings from Last 3 Encounters:  08/05/18 200 lb (90.7 kg)  03/19/18 196 lb 3.2 oz (89 kg)  04/01/17 193 lb (87.5 kg)     Current  Medications:  Current Outpatient Medications on File Prior to Visit  Medication Sig  . atorvastatin (LIPITOR) 20 MG tablet Take 1 tablet (20 mg total) by mouth at bedtime.   No current facility-administered medications on file prior to visit.     Medical History:  Past Medical History:  Diagnosis Date  . Hyperlipidemia 03/18/2017   Allergies: No Known Allergies   Review of Systems:  Review of Systems  Constitutional: Negative.   HENT: Negative.   Eyes: Negative.   Respiratory: Negative.   Cardiovascular: Negative.   Gastrointestinal: Negative.   Genitourinary: Negative.   Musculoskeletal: Negative.   Skin: Negative.     Family history- Review and unchanged Social history- Review and unchanged Physical Exam: BP 124/82   Pulse 72   Temp (!) 97.5 F (36.4 C)   Resp 16   Ht 5\' 11"  (1.803 m)   Wt 200 lb (90.7 kg)   BMI 27.89 kg/m  Wt Readings from Last 3 Encounters:  08/05/18 200 lb (90.7 kg)  03/19/18 196 lb 3.2 oz (89 kg)  04/01/17 193 lb (87.5 kg)   General Appearance: Well nourished, in no apparent distress. Eyes: PERRLA, EOMs, conjunctiva no swelling or erythema Sinuses: No Frontal/maxillary tenderness ENT/Mouth: Ext aud canals clear, TMs without erythema, bulging. No erythema, swelling, or exudate on post pharynx.  Tonsils not swollen or erythematous. Hearing normal.  Neck: Supple, thyroid normal.  Respiratory: Respiratory effort normal, BS equal bilaterally without  rales, rhonchi, wheezing or stridor.  Cardio: RRR with no MRGs. Brisk peripheral pulses without edema.  Abdomen: Soft, + BS,  Non tender, no guarding, rebound, hernias, masses. Lymphatics: Non tender without lymphadenopathy.  Musculoskeletal: Full ROM, 5/5 strength, Normal gait. Foot exam reveals minimal point tenderness over the inferior aspect of right heel, without masses, deformity or edema.  The rest of the foot and ankle exam is normal. Color and temperature of the feet is normal. Peripheral  pulses are normal.  Skin: Warm, dry without rashes, lesions, ecchymosis.  Neuro: Cranial nerves intact. Normal muscle tone, no cerebellar symptoms. Psych: Awake and oriented X 3, normal affect, Insight and Judgment appropriate.    Quentin Mulling, PA-C 2:46 PM [facility]

## 2018-08-05 ENCOUNTER — Other Ambulatory Visit: Payer: Self-pay

## 2018-08-05 ENCOUNTER — Ambulatory Visit (INDEPENDENT_AMBULATORY_CARE_PROVIDER_SITE_OTHER): Payer: 59 | Admitting: Physician Assistant

## 2018-08-05 VITALS — BP 124/82 | HR 72 | Temp 97.5°F | Resp 16 | Ht 71.0 in | Wt 200.0 lb

## 2018-08-05 DIAGNOSIS — M722 Plantar fascial fibromatosis: Secondary | ICD-10-CM | POA: Diagnosis not present

## 2018-08-05 DIAGNOSIS — E785 Hyperlipidemia, unspecified: Secondary | ICD-10-CM

## 2018-08-05 DIAGNOSIS — E559 Vitamin D deficiency, unspecified: Secondary | ICD-10-CM

## 2018-08-05 MED ORDER — ATORVASTATIN CALCIUM 20 MG PO TABS: 20.0000 mg | ORAL_TABLET | Freq: Every day | ORAL | 1 refills | Status: DC

## 2018-08-05 MED ORDER — MELOXICAM 15 MG PO TABS: ORAL_TABLET | ORAL | 1 refills | Status: DC

## 2018-08-05 NOTE — Patient Instructions (Addendum)
Your LDL could improve, ideally we want it under a 100.  Your LDL is the bad cholesterol that can lead to heart attack and stroke. To lower your number you can decrease your fatty foods, red meat, cheese, milk and increase fiber like whole grains and veggies. You can also add a fiber supplement like Citracel or Benefiber, these do not cause gas and bloating and are safe to use. Especially if you have a strong family history of heart disease or stroke or you have evidence of plaque on any imaging like a chest xray, we may discuss at your next office visit putting you on a medication to get your number below 100.   Mobic is an antiinflammatory It helps pain, can not take with aleve, or ibuprofen You can take tylenol (500mg ) or tylenol arthritis (650mg ) with the meloxicam/antiinflammatories. The max you can take of tylenol a day is 3000mg  daily, this is a max of 6 pills a day of the regular tyelnol (500mg ) or a max of 4 a day of the tylenol arthritis (650mg ) as long as no other medications you are taking contain tylenol.   Mobic can cause inflammation in your stomach and can cause ulcers or bleeding, this will look like black tarry stools Make sure you take your mobic with food Try not to take it daily, take AS needed Can take with zantac  Statin therapy  In addition to the known lipid-lowering effects, statins are now widely accepted to have anti-inflammatory and immunomodulatory effects. Adjunctive use of statins has proven beneficial in the context of a wide range of inflammatory diseases, including rheumatoid arthritis.  Research has shown that the salutary effect of statins on cholesterol may not be their only benefit. Statin therapy has shown promise for everything from fighting viral infections to protecting the eye from cataracts.  A 2005 study of more than 700 hospital patients being treated for pneumonia found that the death rate was more than twice as high among those who were not using  statins. In 2006, a [place] study examined the rate of sepsis, a deadly blood infection, among patients who had been hospitalized for heart events. In the two years after their hospitalization, the statin users had a rate of sepsis 19% lower than that of the non-statin users. A 2009 review of 22 studies found that statins appeared to have a beneficial effect on the outcome of infection, but they couldn't come to a firm conclusion.  Do water bottle frozen and roll your foot on it Get inserts for your plantar fasciitis Do stretches at night If it is not better in 6-8 weeks we can do an injection   Plantar Fasciitis  Plantar fasciitis is a painful foot condition that affects the heel. It occurs when the band of tissue that connects the toes to the heel bone (plantar fascia) becomes irritated. This can happen after exercising too much or doing other repetitive activities (overuse injury). The pain from plantar fasciitis can range from mild irritation to severe pain that makes it difficult for you to walk or move. The pain is usually worse in the morning or after you have been sitting or lying down for a while. What are the causes? This condition may be caused by:  Standing for long periods of time.  Wearing shoes that do not fit.  Doing high-impact activities, including running, aerobics, and ballet.  Being overweight.  Having an abnormal way of walking (gait).  Having tight calf muscles.  Having high arches in  your feet.  Starting a new athletic activity.  What are the signs or symptoms? The main symptom of this condition is heel pain. Other symptoms include:  Pain that gets worse after activity or exercise.  Pain that is worse in the morning or after resting.  Pain that goes away after you walk for a few minutes.  How is this diagnosed? This condition may be diagnosed based on your signs and symptoms. Your health care provider will also do a physical exam to check for:  A  tender area on the bottom of your foot.  A high arch in your foot.  Pain when you move your foot.  Difficulty moving your foot.  You may also need to have imaging studies to confirm the diagnosis. These can include:  X-rays.  Ultrasound.  MRI.  How is this treated? Treatment for plantar fasciitis depends on the severity of the condition. Your treatment may include:  Rest, ice, and over-the-counter pain medicines to manage your pain.  Exercises to stretch your calves and your plantar fascia.  A splint that holds your foot in a stretched, upward position while you sleep (night splint).  Physical therapy to relieve symptoms and prevent problems in the future.  Cortisone injections to relieve severe pain.  Extracorporeal shock wave therapy (ESWT) to stimulate damaged plantar fascia with electrical impulses. It is often used as a last resort before surgery.  Surgery, if other treatments have not worked after 12 months.  Follow these instructions at home:  Take medicines only as directed by your health care provider.  Avoid activities that cause pain.  Roll the bottom of your foot over a bag of ice or a bottle of cold water. Do this for 20 minutes, 3-4 times a day.  Perform simple stretches as directed by your health care provider.  Try wearing athletic shoes with air-sole or gel-sole cushions or soft shoe inserts.  Wear a night splint while sleeping, if directed by your health care provider.  Keep all follow-up appointments with your health care provider. How is this prevented?  Do not perform exercises or activities that cause heel pain.  Consider finding low-impact activities if you continue to have problems.  Lose weight if you need to. The best way to prevent plantar fasciitis is to avoid the activities that aggravate your plantar fascia. Contact a health care provider if:  Your symptoms do not go away after treatment with home care measures.  Your pain gets  worse.  Your pain affects your ability to move or do your daily activities. This information is not intended to replace advice given to you by your health care provider. Make sure you discuss any questions you have with your health care provider. Document Released: 01/29/2001 Document Revised: 10/09/2015 Document Reviewed: 03/16/2014 Elsevier Interactive Patient Education  [employer].

## 2018-08-06 LAB — COMPLETE METABOLIC PANEL WITH GFR
AG Ratio: 1.8 (calc) (ref 1.0–2.5)
ALBUMIN MSPROF: 4.8 g/dL (ref 3.6–5.1)
ALT: 47 U/L — AB (ref 9–46)
AST: 27 U/L (ref 10–40)
Alkaline phosphatase (APISO): 59 U/L (ref 36–130)
BUN: 12 mg/dL (ref 7–25)
CALCIUM: 10.1 mg/dL (ref 8.6–10.3)
CO2: 30 mmol/L (ref 20–32)
CREATININE: 1.02 mg/dL (ref 0.60–1.35)
Chloride: 100 mmol/L (ref 98–110)
GFR, EST AFRICAN AMERICAN: 104 mL/min/{1.73_m2} (ref >=60)
GFR, EST NON AFRICAN AMERICAN: 90 mL/min/{1.73_m2} (ref >=60)
GLUCOSE: 81 mg/dL (ref 65–99)
Globulin: 2.6 g/dL (calc) (ref 1.9–3.7)
Potassium: 4.4 mmol/L (ref 3.5–5.3)
Sodium: 139 mmol/L (ref 135–146)
TOTAL PROTEIN: 7.4 g/dL (ref 6.1–8.1)
Total Bilirubin: 0.6 mg/dL (ref 0.2–1.2)

## 2018-08-06 LAB — CBC WITH DIFFERENTIAL/PLATELET
Absolute Monocytes: 482 cells/uL (ref 200–950)
BASOS ABS: 39 {cells}/uL (ref 0–200)
Basophils Relative: 0.7 %
EOS ABS: 269 {cells}/uL (ref 15–500)
Eosinophils Relative: 4.8 %
HCT: 47 % (ref 38.5–50.0)
HEMOGLOBIN: 16.5 g/dL (ref 13.2–17.1)
Lymphs Abs: 2178 cells/uL (ref 850–3900)
MCH: 30.2 pg (ref 27.0–33.0)
MCHC: 35.1 g/dL (ref 32.0–36.0)
MCV: 86.1 fL (ref 80.0–100.0)
MONOS PCT: 8.6 %
MPV: 11.3 fL (ref 7.5–12.5)
Neutro Abs: 2632 cells/uL (ref 1500–7800)
Neutrophils Relative %: 47 %
PLATELETS: 247 10*3/uL (ref 140–400)
RBC: 5.46 10*6/uL (ref 4.20–5.80)
RDW: 12.9 % (ref 11.0–15.0)
TOTAL LYMPHOCYTE: 38.9 %
WBC: 5.6 10*3/uL (ref 3.8–10.8)

## 2018-08-06 LAB — LIPID PANEL
CHOL/HDL RATIO: 4 (calc) (ref <5.0)
Cholesterol: 189 mg/dL (ref <200)
HDL: 47 mg/dL (ref >=40)
LDL CHOLESTEROL (CALC): 117 mg/dL — AB
Non-HDL Cholesterol (Calc): 142 mg/dL (calc) — ABNORMAL HIGH (ref <130)
Triglycerides: 139 mg/dL (ref <150)

## 2018-08-06 LAB — VITAMIN D 25 HYDROXY (VIT D DEFICIENCY, FRACTURES): Vit D, 25-Hydroxy: 25 ng/mL — ABNORMAL LOW (ref 30–100)

## 2018-08-06 LAB — TSH: TSH: 1.76 m[IU]/L (ref 0.40–4.50)

## 2018-09-19 IMAGING — CR DG CHEST 2V
2 series · 2 of 2 positions shown · non-contrast
Comparison: 09/27/2008

CLINICAL DATA: Shortness of breath with exertion, chest pressure

EXAM:
CHEST  2 VIEW

[chest pa]
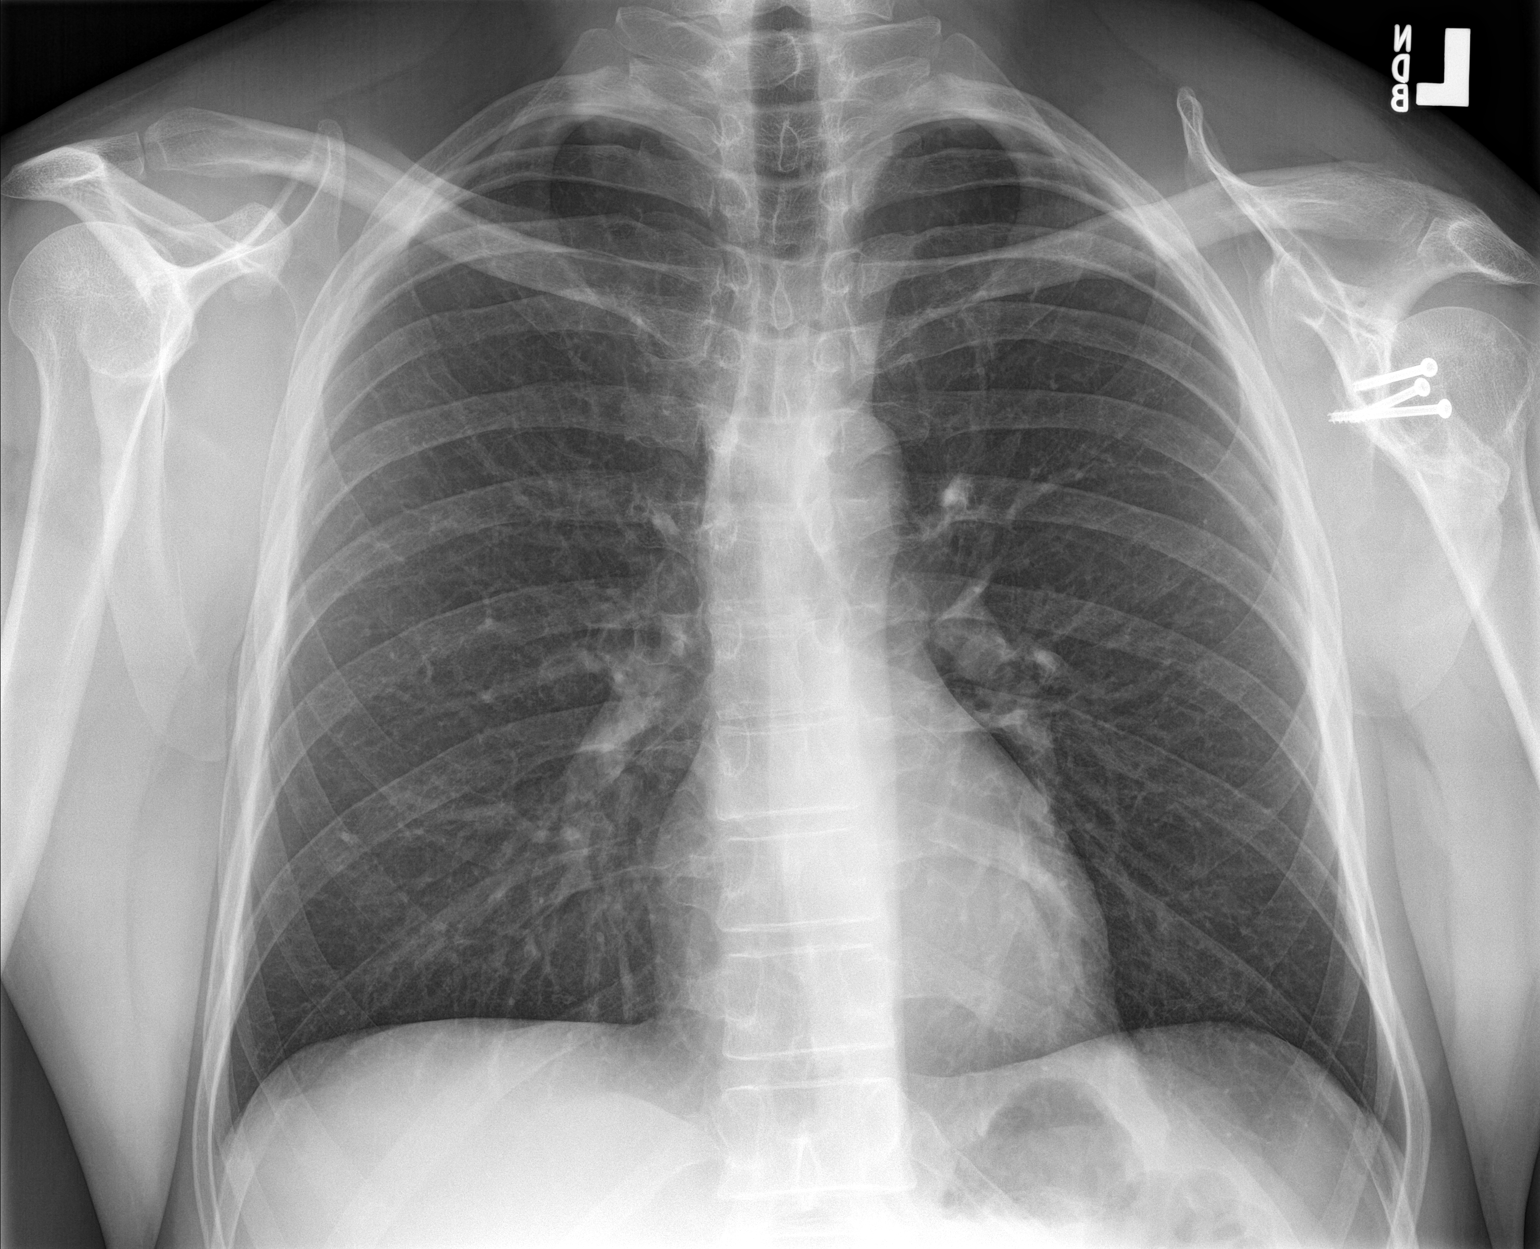

[chest lat]
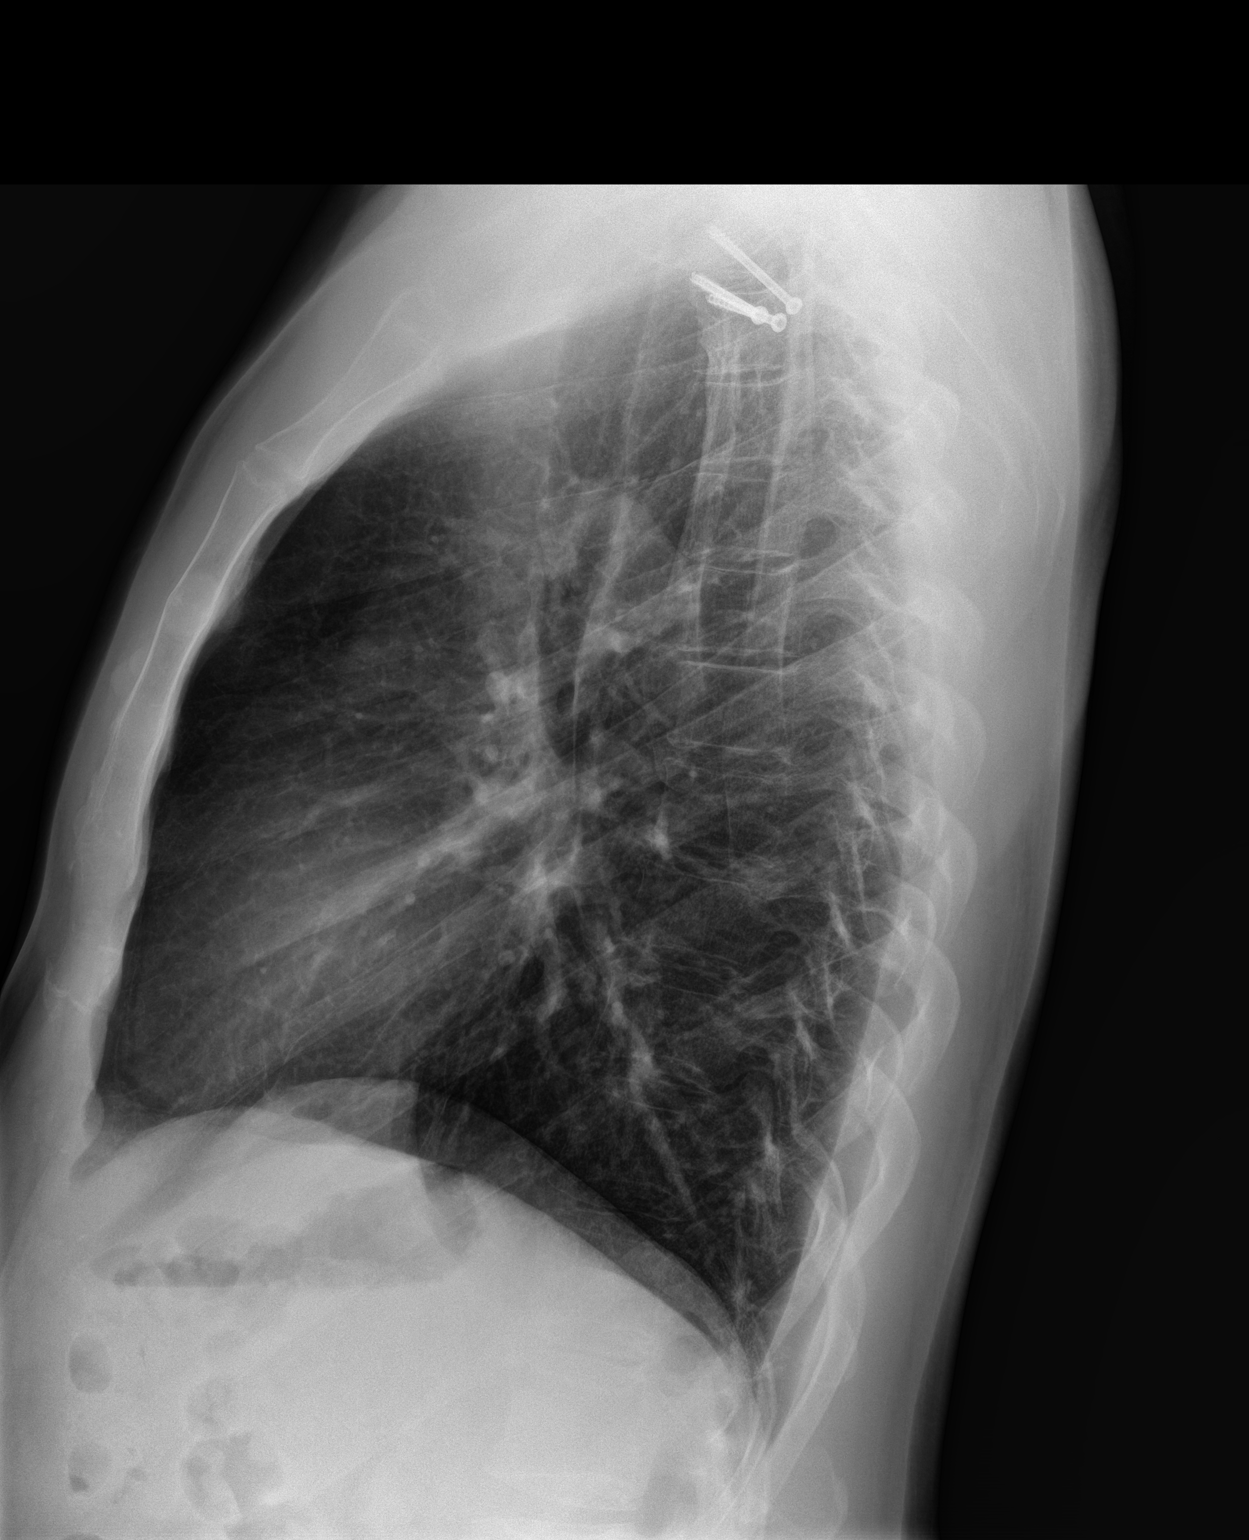

[2 of 2 positions shown; findings below may reference images not displayed]

FINDINGS: Heart and mediastinal contours are within normal limits. No focal
opacities or effusions. No acute bony abnormality.
IMPRESSION: No active cardiopulmonary disease.

## 2018-11-05 ENCOUNTER — Ambulatory Visit: Payer: 59 | Admitting: Adult Health Nurse Practitioner

## 2018-11-10 NOTE — Progress Notes (Signed)
FOLLOW UP  Assessment and Plan:   Hypertension -Continue medication, monitor blood pressure at home. Continue DASH diet.  Reminder to go to the ER if any CP, SOB, nausea, dizziness, severe HA, changes vision/speech, left arm numbness and tingling and jaw pain.  Cholesterol -Continue diet and exercise. Check cholesterol.   Vitamin D Def - check level and continue medications.   Continue diet and meds as discussed. Further disposition pending results of labs. Over 30 minutes of exam, counseling, chart review, and critical decision making was performed  Future Appointments  Date Time Provider Department Center  11/11/2018  3:30 PM Quentin MullingCollier, Ayiana Winslett, PA-C [facility] None  03/24/2019  9:00 AM Quentin Mullingollier, Severus Brodzinski, PA-C [facility] None     HPI [age] y.o. male  presents for 3 month follow up on hypertension, cholesterol, prediabetes, and vitamin D deficiency.   Right handed, having right elbow pain x week. 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 Some numbness/tingling in arm, worse at the end of the day. Has iced and tylenol, helped some.   His blood pressure has been controlled at home, today their BP is BP: 124/80   He does not workout, active work. He denies chest pain, shortness of breath, dizziness.   He  is  on cholesterol medication, is on lipitor 20mg  started last visit and denies myalgias. His cholesterol is not at goal. The cholesterol last visit was:   Lab Results  Component Value Date   CHOL 189 08/05/2018   HDL 47 08/05/2018   LDLCALC 117 (H) 08/05/2018   TRIG 139 08/05/2018   CHOLHDL 4.0 08/05/2018   Patient is on Vitamin D supplement.   Lab Results  Component Value Date   VD25OH 25 (L) 08/05/2018     BMI is Body mass index is 27.22 kg/m., he is working on diet and exercise. Wt Readings from Last 3 Encounters:  11/11/18 195 lb 3.2 oz (88.5 kg)  08/05/18 200 lb (90.7 kg)  03/19/18 196 lb 3.2 oz (89 kg)     Current Medications:   Current Outpatient Medications on File Prior to Visit  Medication Sig  . atorvastatin (LIPITOR) 20 MG tablet Take 1 tablet (20 mg total) by mouth at bedtime.  . meloxicam (MOBIC) 15 MG tablet Take one daily with food for 4 weeks, can take with tylenol, can not take with aleve, iburpofen, then as needed daily for pain   No current facility-administered medications on file prior to visit.     Medical History:  Past Medical History:  Diagnosis Date  . Hyperlipidemia 03/18/2017   Allergies: No Known Allergies   Review of Systems:  Review of Systems  Constitutional: Negative.   HENT: Negative.   Eyes: Negative.   Respiratory: Negative.   Cardiovascular: Negative.   Gastrointestinal: Negative.   Genitourinary: Negative.   Musculoskeletal: Positive for myalgias.  Skin: Negative.     Family history- Review and unchanged Social history- Review and unchanged Physical Exam: BP 124/80   Pulse 76   Temp 97.9 F (36.6 C)   Ht 5\' 11"  (1.803 m)   Wt 195 lb 3.2 oz (88.5 kg)   SpO2 97%   BMI 27.22 kg/m  Wt Readings from Last 3 Encounters:  11/11/18 195 lb 3.2 oz (88.5 kg)  08/05/18 200 lb (90.7 kg)  03/19/18 196 lb 3.2 oz (89 kg)   General Appearance: Well nourished, in no apparent distress. Eyes: PERRLA, EOMs, conjunctiva no swelling or erythema Sinuses: No Frontal/maxillary tenderness ENT/Mouth: Ext  aud canals clear, TMs without erythema, bulging. No erythema, swelling, or exudate on post pharynx.  Tonsils not swollen or erythematous. Hearing normal.  Neck: Supple, thyroid normal.  Respiratory: Respiratory effort normal, BS equal bilaterally without rales, rhonchi, wheezing or stridor.  Cardio: RRR with no MRGs. Brisk peripheral pulses without edema.  Abdomen: Soft, + BS,  Non tender, no guarding, rebound, hernias, masses. Lymphatics: Non tender without lymphadenopathy.  Musculoskeletal: Full ROM, 5/5 strength, Normal gait. Right elbow without swelling, warmth, or erythema. No  tenderness at epicondyles, + tinel's sign and elbow flexion test, good distal neurovascular exam Skin: Warm, dry without rashes, lesions, ecchymosis.  Neuro: Cranial nerves intact. Normal muscle tone, no cerebellar symptoms. Psych: Awake and oriented X 3, normal affect, Insight and Judgment appropriate.    Vicie Mutters, PA-C 3:27 PM [facility]

## 2018-11-11 ENCOUNTER — Encounter: Payer: Self-pay | Admitting: Physician Assistant

## 2018-11-11 ENCOUNTER — Other Ambulatory Visit: Payer: Self-pay

## 2018-11-11 ENCOUNTER — Ambulatory Visit (INDEPENDENT_AMBULATORY_CARE_PROVIDER_SITE_OTHER): Payer: 59 | Admitting: Physician Assistant

## 2018-11-11 VITALS — BP 124/80 | HR 76 | Temp 97.9°F | Ht 71.0 in | Wt 195.2 lb

## 2018-11-11 DIAGNOSIS — E559 Vitamin D deficiency, unspecified: Secondary | ICD-10-CM | POA: Diagnosis not present

## 2018-11-11 DIAGNOSIS — Z6827 Body mass index (BMI) 27.0-27.9, adult: Secondary | ICD-10-CM | POA: Diagnosis not present

## 2018-11-11 DIAGNOSIS — E785 Hyperlipidemia, unspecified: Secondary | ICD-10-CM | POA: Diagnosis not present

## 2018-11-11 DIAGNOSIS — G5621 Lesion of ulnar nerve, right upper limb: Secondary | ICD-10-CM

## 2018-11-11 DIAGNOSIS — Z79899 Other long term (current) drug therapy: Secondary | ICD-10-CM | POA: Diagnosis not present

## 2018-11-11 MED ORDER — PREDNISONE 20 MG PO TABS: ORAL_TABLET | ORAL | 0 refills | Status: AC

## 2018-11-11 NOTE — Patient Instructions (Addendum)
VITAMIN D IS IMPORTANT  Vitamin D goal is between 48-80  Yours was 25 last visit, get on 2 of the 50,000 a week for 8 weeks, then go to once a week.   Please make sure that you are taking your Vitamin D as directed.   It is very important as a natural anti-inflammatory   helping hair, skin, and nails, as well as reducing stroke and heart attack risk.   It helps your bones and helps with mood.   It also decreases numerous cancer risks so please take it as directed.   Low Vit D is associated with a 200-300% higher risk for CANCER   and 200-300% higher risk for HEART   ATTACK  &  STROKE.    .....................................Marland Kitchen  It is also associated with higher death rate at younger ages,   autoimmune diseases like Rheumatoid arthritis, Lupus, Multiple Sclerosis.     Also many other serious conditions, like depression, Alzheimer's  Dementia, infertility, muscle aches, fatigue, fibromyalgia - just to name a few.     Please take the prednisone to help decrease inflammation and therefore decrease symptoms. Take it it with food to avoid GI upset. It can cause increased energy but on the other hand it can make it hard to sleep at night so please take it AT [place], it takes 8-12 hours to start working so it will NOT affect your sleeping if you take it at night with your food!!  If you are diabetic it will increase your sugars so decrease carbs and monitor your sugars closely.     Cubital Tunnel Syndrome  Cubital tunnel syndrome is a condition that causes pain and weakness of the forearm and hand. It happens when one of the nerves that runs along the inside of the elbow joint (ulnar nerve) becomes irritated. This condition is usually caused by repeated arm motions that are done during sports or work-related activities. What are the causes? This condition may be caused by:  Increased pressure on the ulnar nerve at the elbow, arm, or forearm. This can result from: ?  Irritation caused by repeated elbow bending. ? Poorly healed elbow fractures. ? Tumors in the elbow. These are usually noncancerous (benign). ? Scar tissue that develops in the elbow after an injury. ? Bony growths (spurs) near the ulnar nerve.  Stretching of the nerve due to loose elbow ligaments.  Trauma to the nerve at the elbow. What increases the risk? The following factors may make you more likely to develop this condition:  Doing manual labor that requires frequent bending of the elbow.  Playing sports that include repeated or strenuous throwing motions, such as baseball.  Playing contact sports, such as football or lacrosse.  Not warming up properly before activities.  Having diabetes.  Having an underactive thyroid (hypothyroidism). What are the signs or symptoms? Symptoms of this condition include:  Clumsiness or weakness of the hand.  Tenderness of the inner elbow.  Aching or soreness of the inner elbow, forearm, or fingers, especially the little finger or the ring finger.  Increased pain when forcing the elbow to bend.  Reduced control when throwing objects.  Tingling, numbness, or a burning feeling inside the forearm or in part of the hand or fingers, especially the little finger or the ring finger.  Sharp pains that shoot from the elbow down to the wrist and hand.  The inability to grip or pinch hard. How is this diagnosed? This condition is diagnosed based on:  Your symptoms and medical history. Your health care provider will also ask for details about any injury.  A physical exam. You may also have tests, including:  Electromyogram (EMG). This test measures electrical signals sent by your nerves into the muscles.  Nerve conduction study. This test measures how well electrical signals pass through your nerves.  Imaging tests, such as X-rays, ultrasound, and MRI. These tests check for possible causes of your condition. How is this treated? This  condition may be treated by:  Stopping the activities that are causing your symptoms to get worse.  Icing and taking medicines to reduce pain and swelling.  Wearing a splint to prevent your elbow from bending, or wearing an elbow pad where the ulnar nerve is closest to the skin.  Working with a physical therapist in less severe cases. This may help to: ? Decrease your symptoms. ? Improve the strength and range of motion of your elbow, forearm, and hand. If these treatments do not help, surgery may be needed. Follow these instructions at home: If you have a splint:  Wear the splint as told by your health care provider. Remove it only as told by your health care provider.  Loosen the splint if your fingers tingle, become numb, or turn cold and blue.  Keep the splint clean.  If the splint is not waterproof: ? Do not let it get wet. ? Cover it with a watertight covering when you take a bath or shower. Managing pain, stiffness, and swelling   If directed, put ice on the injured area: ? Put ice in a plastic bag. ? Place a towel between your skin and the bag. ? Leave the ice on for 20 minutes, 2-3 times a day.  Move your fingers often to avoid stiffness and to lessen swelling.  Raise (elevate) the injured area above the level of your heart while you are sitting or lying down. General instructions  Take over-the-counter and prescription medicines only as told by your health care provider.  Do any exercise or physical therapy as told by your health care provider.  Do not drive or use heavy machinery while taking prescription pain medicine.  If you were given an elbow pad, wear it as told by your health care provider.  Keep all follow-up visits as told by your health care provider. This is important. Contact a health care provider if:  Your symptoms get worse.  Your symptoms do not get better with treatment.  You have new pain.  Your hand on the injured side feels numb or  cold. Summary  Cubital tunnel syndrome is a condition that causes pain and weakness of the forearm and hand.  You are more likely to develop this condition if you do work or play sports that involve repeated arm movements.  This condition is often treated by stopping repetitive activities, applying ice, and using anti-inflammatory medicines.  In rare cases, surgery may be needed. This information is not intended to replace advice given to you by your health care provider. Make sure you discuss any questions you have with your health care provider. Document Released: 05/06/2005 Document Revised: 09/22/2017 Document Reviewed: 09/22/2017 Elsevier Interactive Patient Education  2019 [employer]

## 2018-11-12 LAB — COMPLETE METABOLIC PANEL WITH GFR
AG Ratio: 1.6 (calc) (ref 1.0–2.5)
ALT: 63 U/L — ABNORMAL HIGH (ref 9–46)
AST: 29 U/L (ref 10–40)
Albumin: 4.6 g/dL (ref 3.6–5.1)
Alkaline phosphatase (APISO): 62 U/L (ref 36–130)
BUN: 14 mg/dL (ref 7–25)
CO2: 32 mmol/L (ref 20–32)
Calcium: 9.9 mg/dL (ref 8.6–10.3)
Chloride: 103 mmol/L (ref 98–110)
Creat: 1 mg/dL (ref 0.60–1.35)
GFR, Est African American: 106 mL/min/{1.73_m2} (ref >=60)
GFR, Est Non African American: 92 mL/min/{1.73_m2} (ref >=60)
Globulin: 2.8 g/dL (calc) (ref 1.9–3.7)
Glucose, Bld: 72 mg/dL (ref 65–99)
Potassium: 4 mmol/L (ref 3.5–5.3)
Sodium: 140 mmol/L (ref 135–146)
Total Bilirubin: 0.6 mg/dL (ref 0.2–1.2)
Total Protein: 7.4 g/dL (ref 6.1–8.1)

## 2018-11-12 LAB — CBC WITH DIFFERENTIAL/PLATELET
Absolute Monocytes: 714 cells/uL (ref 200–950)
Basophils Absolute: 28 cells/uL (ref 0–200)
Basophils Relative: 0.4 %
Eosinophils Absolute: 182 cells/uL (ref 15–500)
Eosinophils Relative: 2.6 %
HCT: 47.7 % (ref 38.5–50.0)
Hemoglobin: 16.2 g/dL (ref 13.2–17.1)
Lymphs Abs: 2513 cells/uL (ref 850–3900)
MCH: 29.5 pg (ref 27.0–33.0)
MCHC: 34 g/dL (ref 32.0–36.0)
MCV: 86.7 fL (ref 80.0–100.0)
MPV: 11.2 fL (ref 7.5–12.5)
Monocytes Relative: 10.2 %
Neutro Abs: 3563 cells/uL (ref 1500–7800)
Neutrophils Relative %: 50.9 %
Platelets: 225 10*3/uL (ref 140–400)
RBC: 5.5 10*6/uL (ref 4.20–5.80)
RDW: 13.3 % (ref 11.0–15.0)
Total Lymphocyte: 35.9 %
WBC: 7 10*3/uL (ref 3.8–10.8)

## 2018-11-12 LAB — TSH: TSH: 2.14 mIU/L (ref 0.40–4.50)

## 2018-11-12 LAB — LIPID PANEL
Cholesterol: 264 mg/dL — ABNORMAL HIGH (ref <200)
HDL: 42 mg/dL (ref >=40)
LDL Cholesterol (Calc): 171 mg/dL (calc) — ABNORMAL HIGH
Non-HDL Cholesterol (Calc): 222 mg/dL (calc) — ABNORMAL HIGH (ref <130)
Total CHOL/HDL Ratio: 6.3 (calc) — ABNORMAL HIGH (ref <5.0)
Triglycerides: 286 mg/dL — ABNORMAL HIGH (ref <150)

## 2018-11-12 LAB — VITAMIN D 25 HYDROXY (VIT D DEFICIENCY, FRACTURES): Vit D, 25-Hydroxy: 22 ng/mL — ABNORMAL LOW (ref 30–100)

## 2018-11-12 LAB — MAGNESIUM: Magnesium: 2.1 mg/dL (ref 1.5–2.5)

## 2019-02-01 ENCOUNTER — Ambulatory Visit (INDEPENDENT_AMBULATORY_CARE_PROVIDER_SITE_OTHER): Payer: 59 | Admitting: Physician Assistant

## 2019-02-01 ENCOUNTER — Other Ambulatory Visit: Payer: Self-pay

## 2019-02-01 ENCOUNTER — Encounter: Payer: Self-pay | Admitting: Physician Assistant

## 2019-02-01 ENCOUNTER — Ambulatory Visit (HOSPITAL_COMMUNITY)
Admission: RE | Admit: 2019-02-01 | Discharge: 2019-02-01 | Disposition: A | Discharge status: Home / Self Care | Payer: 59 | Source: Ambulatory Visit | Attending: Physician Assistant | Admitting: Physician Assistant

## 2019-02-01 DIAGNOSIS — R51 Headache: Secondary | ICD-10-CM

## 2019-02-01 DIAGNOSIS — R519 Headache, unspecified: Secondary | ICD-10-CM

## 2019-02-01 MED ORDER — ATENOLOL 25 MG PO TABS: 25.0000 mg | ORAL_TABLET | Freq: Every day | ORAL | 1 refills | Status: DC

## 2019-02-01 NOTE — Progress Notes (Signed)
Subjective:    Patient ID: [name] [name], male    DOB: 1976-01-17, 43 y.o.   MRN: [id number]  HPI 43 y.o. WM with history of hyperlipidemia, former smoker (Q 2003)  presents with worst headache ever.   States last Saturday had extreme headache during intercourse, states 10/10 of pain, worse he ever has. He has had a headache ever since then worse at night.  Has noticed today some blurry vision but no vision loss, diplopia No nausea,  one-sided weakness, numbness, tingling, slurring of speech, droopy face, swallowing difficulties, hearing loss or tinnitus.  Bilateral head pain, runs around to that back of his head and into his neck.  Tylenol and cold compress helps, would last x 1 hour.  Woke up this AM with headache but not as bad as before.  BP was normal, no fever, chills.  He has a very physical job but states no HA with that.   Blood pressure 124/68, pulse 89, temperature (!) 97.2 F (36.2 C), height 5\' 11"  (1.803 m), weight 195 lb 3.2 oz (88.5 kg), SpO2 99 %.  Medications Current Outpatient Medications on File Prior to Visit  Medication Sig  . atorvastatin (LIPITOR) 20 MG tablet Take 1 tablet (20 mg total) by mouth at bedtime.  . meloxicam (MOBIC) 15 MG tablet Take one daily with food for 4 weeks, can take with tylenol, can not take with aleve, iburpofen, then as needed daily for pain   No current facility-administered medications on file prior to visit.     Problem list He has MRSA colonization; BMI 27.0-27.9,adult; and Hyperlipidemia on their problem list.  Family History  Problem Relation Age of Onset  . Hypertension Mother   . Migraines Mother   . Depression Mother   . Heart disease Maternal Uncle 67  . Cancer Maternal Grandmother        breast  . Heart disease Maternal Grandfather     Review of Systems  Constitutional: Negative.  Negative for chills, fatigue and fever.  HENT: Positive for postnasal drip. Negative for congestion, ear pain, hearing loss,  rhinorrhea, sinus pressure, sinus pain, sore throat, tinnitus, trouble swallowing and voice change.   Eyes: Positive for visual disturbance. Negative for photophobia, pain, discharge, redness and itching.  Respiratory: Negative.  Negative for shortness of breath.   Cardiovascular: Negative.  Negative for chest pain.  Gastrointestinal: Negative.  Negative for diarrhea.  Genitourinary: Negative.   Musculoskeletal: Negative.  Negative for neck pain.  Neurological: Positive for headaches. Negative for dizziness, tremors, seizures, syncope, facial asymmetry, speech difficulty, weakness, light-headedness and numbness.  Psychiatric/Behavioral: Negative.  Negative for hallucinations.       Objective:   Physical Exam Constitutional:      Appearance: He is well-developed.  HENT:     Head: Normocephalic and atraumatic.     Right Ear: External ear normal.     Left Ear: External ear normal.     Mouth/Throat:     Mouth: Mucous membranes are moist.     Tongue: Tongue deviation: slight deviation to the right however patient has ankyloglossia.  Eyes:     Conjunctiva/sclera: Conjunctivae normal.     Pupils: Pupils are equal, round, and reactive to light.  Neck:     Musculoskeletal: Normal range of motion and neck supple.  Cardiovascular:     Rate and Rhythm: Normal rate and regular rhythm.     Heart sounds: Normal heart sounds.  Pulmonary:     Effort: Pulmonary effort is normal.  Breath sounds: Normal breath sounds.  Abdominal:     General: Bowel sounds are normal.     Palpations: Abdomen is soft.  Musculoskeletal: Normal range of motion.  Skin:    General: Skin is warm and dry.  Neurological:     Mental Status: He is alert and oriented to person, place, and time.     Cranial Nerves: No cranial nerve deficit.  Psychiatric:        Behavior: Behavior normal.        Assessment & Plan:   Acute intractable headache, unspecified headache type -     CT Head Wo Contrast; Future -      atenolol (TENORMIN) 25 MG tablet; Take 1 tablet (25 mg total) by mouth at bedtime. - CT head without to rule out SAH with worse headache ever and blurry vision during intercourse.  - start on atenolol low dose at night for possible migraine prevention.   Will go to the ER if worsening headache, changes vision/speech, imbalance, weakness.    Future Appointments  Date Time Provider Department Center  03/24/2019  9:00 AM Quentin Mullingollier, Ermias Tomeo, PA-C [facility] None

## 2019-02-01 NOTE — Patient Instructions (Signed)
1.  Limit use of pain relievers to no more than 2 days out of week to prevent risk of rebound or medication-overuse headache. 2.  Keep headache diary 3.  Exercise, hydration, caffeine cessation, sleep hygiene, monitor for and avoid triggers 4.  Consider:  magnesium citrate 400mg  daily, riboflavin 400mg  daily, and coenzyme Q10 100mg  three times daily   We may also treat TMJ if we think you have it If you are having frequent migraines we may put you on a once a day medication with fast acting medication to take. Also there is such a thing called rebound headache from over use of acute medications.  Please do not use rescue or acute medications more than 10 days a month or more than 3 days per week, this can cause a withdrawal and a rebound headache.  Here is more information below  Please remember, common headache triggers are: sleep deprivation, dehydration, overheating, stress, hypoglycemia or skipping meals and blood sugar fluctuations, excessive pain medications or excessive alcohol use or caffeine withdrawal. Some people have food triggers such as aged cheese, orange juice or chocolate, especially dark chocolate, or MSG (monosodium glutamate). Try to avoid these headache triggers as much possible.   It may be helpful to keep a headache diary to figure out what makes your headaches worse or brings them on and what alleviates them. Some people report headache onset after exercise but studies have shown that regular exercise may actually prevent headaches from coming. If you have exercise-induced headaches, please make sure that you drink plenty of fluid before and after exercising and that you do not over do it and do not overheat.   Please go to the ER if there is weakness, thunderclap headache, visual changes, or any concerning factors    Migraine Headache A migraine headache is an intense, throbbing pain on one or both sides of your head. Recurrent migraines keep coming back. A migraine  can last for 30 minutes to several hours. CAUSES  The exact cause of a migraine headache is not always known. However, a migraine may be caused when nerves in the brain become irritated and release chemicals that cause inflammation. This causes pain. Certain things may also trigger migraines, such as:   Alcohol.  Smoking.  Stress.  Menstruation.  Aged cheeses.  Foods or drinks that contain nitrates, glutamate, aspartame, or tyramine.  Lack of sleep.  Chocolate.  Caffeine.  Hunger.  Physical exertion.  Fatigue.  Medicines used to treat chest pain (nitroglycerine), birth control pills, estrogen, and some blood pressure medicines. SYMPTOMS   Pain on one or both sides of your head.  Pulsating or throbbing pain.  Severe pain that prevents daily activities.  Pain that is aggravated by any physical activity.  Nausea, vomiting, or both.  Dizziness.  Pain with exposure to bright lights, loud noises, or activity.  General sensitivity to bright lights, loud noises, or smells. Before you get a migraine, you may get warning signs that a migraine is coming (aura). An aura may include:  Seeing flashing lights.  Seeing bright spots, halos, or zigzag lines.  Having tunnel vision or blurred vision.  Having feelings of numbness or tingling.  Having trouble talking.  Having muscle weakness. DIAGNOSIS  A recurrent migraine headache is often diagnosed based on:  Symptoms.  Physical examination.  A CT scan or MRI of your head. These imaging tests cannot diagnose migraines but can help rule out other causes of headaches.   TREATMENT  Medicines may be  given for pain and nausea. Medicines can also be given to help prevent recurrent migraines. HOME CARE INSTRUCTIONS  Only take over-the-counter or prescription medicines for pain or discomfort as directed by your health care provider. The use of long-term narcotics is not recommended.  Lie down in a dark, quiet room when  you have a migraine.  Keep a journal to find out what may trigger your migraine headaches. For example, write down:  What you eat and drink.  How much sleep you get.  Any change to your diet or medicines.  Limit alcohol consumption.  Quit smoking if you smoke.  Get 7-9 hours of sleep, or as recommended by your health care provider.  Limit stress.  Keep lights dim if bright lights bother you and make your migraines worse. SEEK MEDICAL CARE IF:   You do not get relief from the medicines given to you.  You have a recurrence of pain.  You have a fever. SEEK IMMEDIATE MEDICAL CARE IF:  Your migraine becomes severe.  You have a stiff neck.  You have loss of vision.  You have muscular weakness or loss of muscle control.  You start losing your balance or have trouble walking.  You feel faint or pass out. . You have severe symptoms that are different from your first symptoms. MAKE SURE YOU:   Understand these instructions.  Will watch your condition.  Will get help right away if you are not doing well or get worse.   This information is not intended to replace advice given to you by your health care provider. Make sure you discuss any questions you have with your health care provider.   Document Released: 01/29/2001 Document Revised: 05/27/2014 Document Reviewed: 01/11/2013 Elsevier Interactive Patient Education 2016 [employer]  Common Migraine Triggers   Foods Aged cheese, alcohol, nuts, chocolate, yogurt, onions, figs, liver, caffeinated foods and beverages, monosodium glutamate (MSG), smoked or pickled fish/meat, nitrate/nitrate preserved foods (hotdogs, pepperoni, salami) tyramine  Medications Antibiotics (tetracycline, griseofulvin), antihypertensives (nifedipine, captopril), hormones (oral contraceptives, estrogens), histamine-2 blockers (cimetidine, raniidine, vasodilators (nitroglycerine, isosorbide dinitrate)  Sensory Stimuli Flickering/bright/fluorescent  lights, bright sunlight, odors (perfume, chemicals, cigarette smoke)  Lifestyle Changes Time zones, sleep patterns, eating habits, caffeine withdrawal stress  Other Menstrual cycle, weather/season/air pressure changes, high altitude  Adapted from [place] and [place]omon, [place]. Clin. J. Med. 1995; [place] and [place]ell. Conquering Headache, 1998  Hormonal variations also are believed to play a part.  Fluctuations of the male hormone estrogen (such as just before menstruation) affect a chemical called serotonin-when serotonin levels in the brain fall, the dilation (expansion) of blood vessels in the brain that is characteristic of migraine often follows.  Many factors or "triggers" can start a migraine.  In people who get migraines, most experts think certain activities or foods may trigger temporary changes in the blood vessels around the brain.  Swelling of these blood vessels may cause pain in the nearby nerves.  Allergy Headaches:  Hotdogs Milk  Onions  Thyme Bacon  Chocolate Garlic  Nutmeg Ham  Dark Cola Pork  Cinnamon Salami  Nuts  Egg  Ginger Sausage Red wine Cloves  Cheddar Cheese Caffeine   Cervicogenic Headache  A cervicogenic headache is a headache caused by a condition that affects the bones and tissues in your neck (cervical spine). In a cervicogenic headache, the pain moves from your neck to your head. Most cervicogenic headaches start in the upper part of the neck with the first three cervical bones (cervical vertebrae). A  cervicogenic headache is diagnosed when a cause can be found in the cervical spine and other causes of headaches can be ruled out. What are the causes? The most common cause of this condition is a traumatic injury to the cervical spine, such as whiplash. Other causes include:  Arthritis.  Broken bone (fracture).  Infection.  Tumor. What are the signs or symptoms? The most common symptoms are neck and head pain. The pain is often located on one side. In  some cases, there may be head pain without neck pain. Pain may be felt in the neck, back or side of the head, face, or behind the eyes. Other symptoms include:  Limited movement in the neck.  Arm or shoulder pain. How is this diagnosed? This condition may be diagnosed based on:  Your symptoms.  A physical exam.  An injection that blocks nerve signals (nerve block).  Imaging tests, such as: ? X-rays. ? CT scan. ? MRI. How is this treated? Treatment for this condition may depend on the underlying condition. Treatment may include:  Medicines, such as: ? NSAIDs. ? Muscle relaxants.  Physical therapy.  Massage therapy.  Complementary therapies, such as: ? Biofeedback. ? Meditation. ? Acupuncture.  Nerve block injections.  Botulinum toxin injections. Your treatment plan may involve working with a pain management team that includes your primary health care provider, a pain management specialist, a neurologist, and a physical therapist. Follow these instructions at home:  Take over-the-counter and prescription medicines only as told by your health care provider.  Do exercises at home as told by your physical therapist.  Return to your normal activities as told by your health care provider. Ask your health care provider what activities are safe for you. Avoid activities that trigger your headaches.  Maintain good neck support and posture at home and at work.  Keep all follow-up visits as told by your health care provider. This is important. Contact a health care provider if you have:  Headaches that are getting worse and happening more often.  Headaches with any of the following: ? Fever. ? Numbness. ? Weakness. ? Dizziness. ? Nausea or vomiting. Get help right away if:  You have a very sudden and severe headache. Summary  A cervicogenic headache is a headache caused by a condition that affects the bones and tissues in your cervical spine.  Your health care  provider may diagnose this condition with a physical exam, a nerve block, and imaging tests.  Treatment may include medicine to reduce pain and inflammation, physical therapy, and nerve block injections.  Complementary therapies, such as acupuncture and meditation, may be added to other treatments.  Your treatment plan may involve working with a pain management team that includes your primary health care provider, a pain management specialist, a neurologist, and a physical therapist. This information is not intended to replace advice given to you by your health care provider. Make sure you discuss any questions you have with your health care provider. Document Released: 07/27/2003 Document Revised: 08/26/2018 Document Reviewed: 05/16/2017 Elsevier Patient Education  2020 [employer].

## 2019-03-23 NOTE — Progress Notes (Signed)
Complete Physical  Assessment and Plan:  BMI 27.0-27.9,adult Monitor weight, increase veggies  Needs flu shot -     FLU VACCINE MDCK QUAD W/Preservative  MRSA colonization Will monitor  Medication management -     CBC with Differential/Platelet -     BASIC METABOLIC PANEL WITH GFR -     Hepatic function panel -     Magnesium  Vitamin D deficiency -     VITAMIN D 25 Hydroxy (Vit-D Deficiency, Fractures)  Encounter for general adult medical examination with abnormal findings  Gastroesophageal reflux disease without esophagitis Continue PPI/H2 blocker, diet discussed  Anxiety -     TSH - declines meds at this time, will check labs  Hyperlipidimia -     Lipid panel Get back on lipitor, recheck chol 3 months decrease fatty foods increase activity.   Screening for blood or protein in urine -     Urinalysis, Routine w reflex microscopic -     Microalbumin / creatinine urine ratio  Screening for blood or protein in urine -     Urinalysis, Routine w reflex microscopic -     Microalbumin / creatinine urine ratio  Cubital tunnel syndrome Has failed conservative treatment, will refer to ortho since not improving -     meloxicam (MOBIC) 15 MG tablet; Take one daily with food for 2 weeks, can take with tylenol, can not take with aleve, iburpofen, then as needed daily for pain      Discussed med's effects and SE's. Screening labs and tests as requested with regular follow-up as recommended. Over 40 minutes of exam, counseling, chart review and critical decision making was performed  HPI Patient presents for a complete physical.   His blood pressure has been controlled at home, today their BP is BP: 124/88 He does not workout, but very physical job. He denies chest pain, shortness of breath, dizziness.   Right handed, having right elbow pain , worsened x 1 week. Works Holiday representative so uses Psychologist, clinical, lifts heavy boxes/wood etc but no injury. Some numbness/tingling in right  4th and 5th digit, worse at the end of the day. Has iced and tylenol, compression at night, helped some.  Has broken his right arm x 2 with history of plates.   Had colonoscopy 2014 in [place].  Has horses at their house, wife is [place] Has daughter 48 and son 56.    His blood pressure has been controlled at home, today their BP is BP: 124/88  He does not workout. He denies chest pain, shortness of breath, dizziness. BMI is Body mass index is 28.03 kg/m., he is working on diet and exercise. Wt Readings from Last 3 Encounters:  03/24/19 201 lb (91.2 kg)  02/01/19 195 lb 3.2 oz (88.5 kg)  11/11/18 195 lb 3.2 oz (88.5 kg)    He had a negative stress test 03/2017 with Dr. [place].   He is on cholesterol medication, lipitor 20 mg daily, this was started last visit and denies myalgias. His cholesterol is not at goal. The cholesterol last visit was:   Lab Results  Component Value Date   CHOL 264 (H) 11/11/2018   HDL 42 11/11/2018   LDLCALC 171 (H) 11/11/2018   TRIG 286 (H) 11/11/2018   CHOLHDL 6.3 (H) 11/11/2018   Patient is on Vitamin D supplement.   Lab Results  Component Value Date   VD25OH 22 (L) 11/11/2018     Lab Results  Component Value Date   HGBA1C 5.3 03/17/2017  Current Medications:  Current Outpatient Medications on File Prior to Visit  Medication Sig Dispense Refill  . atorvastatin (LIPITOR) 20 MG tablet Take 1 tablet (20 mg total) by mouth at bedtime. 90 tablet 1  . meloxicam (MOBIC) 15 MG tablet Take one daily with food for 4 weeks, can take with tylenol, can not take with aleve, iburpofen, then as needed daily for pain 90 tablet 1  . atenolol (TENORMIN) 25 MG tablet Take 1 tablet (25 mg total) by mouth at bedtime. (Patient not taking: Reported on 03/24/2019) 90 tablet 1   No current facility-administered medications on file prior to visit.    Allergies:  No Known Allergies   Health Maintenance:  Immunization History  Administered Date(s) Administered  .  Influenza Inj Mdck Quad With Preservative 03/17/2017, 03/19/2018  . Tdap 03/17/2017    Tetanus: 2018 Pneumovax: n/a Prevnar 13: n/a Flu vaccine: TODAY Zostavax:n/a  DEXA: Colonoscopy: had 2014, will get exact date EGD: STRESS TEST 03/2017  Patient Care Team: Lucky CowboyMcKeown, William, MD as PCP - General (Internal Medicine)  Medical History:  has MRSA colonization; BMI 27.0-27.9,adult; and Hyperlipidemia on their problem list. Surgical History:  He  has a past surgical history that includes Shoulder surgery (Left, 2013). Family History:  His family history includes Cancer in his maternal grandmother; Depression in his mother; Heart disease in his maternal grandfather; Heart disease (age of onset: 5267) in his maternal uncle; Hypertension in his mother; Migraines in his mother. Social History:   reports that he quit smoking about 17 years ago. His smoking use included cigarettes. He has a 4.00 pack-year smoking history. He has never used smokeless tobacco. He reports current alcohol use of about 2.0 standard drinks of alcohol per week. He reports that he does not use drugs.   Review of Systems:  Review of Systems  Constitutional: Negative for chills, diaphoresis, fever, malaise/fatigue and weight loss.  HENT: Negative.   Eyes: Negative.   Respiratory: Negative for cough, hemoptysis, sputum production, shortness of breath and wheezing.   Cardiovascular: Negative for chest pain, palpitations, orthopnea, claudication, leg swelling and PND.  Gastrointestinal: Negative for abdominal pain, blood in stool, constipation, diarrhea, heartburn, melena, nausea and vomiting.  Genitourinary: Negative.   Musculoskeletal: Positive for joint pain.  Skin: Negative.  Negative for rash.  Neurological: Negative for weakness.  Psychiatric/Behavioral: The patient is nervous/anxious and has insomnia.     Physical Exam: Estimated body mass index is 28.03 kg/m as calculated from the following:   Height as of  this encounter: 5\' 11"  (1.803 m).   Weight as of this encounter: 201 lb (91.2 kg). BP 124/88   Pulse 70   Temp (!) 97.3 F (36.3 C)   Ht 5\' 11"  (1.803 m)   Wt 201 lb (91.2 kg)   SpO2 95%   BMI 28.03 kg/m  General Appearance: Well nourished, in no apparent distress.  Eyes: PERRLA, EOMs, conjunctiva no swelling or erythema, normal fundi and vessels.  Sinuses: No Frontal/maxillary tenderness  ENT/Mouth: Ext aud canals clear, normal light reflex with TMs without erythema, bulging. Good dentition. No erythema, swelling, or exudate on post pharynx. Tonsils not swollen or erythematous. Hearing normal.  Neck: Supple, thyroid normal. No bruits  Respiratory: Respiratory effort normal, BS equal bilaterally without rales, rhonchi, wheezing or stridor.  Cardio: RRR without murmurs, rubs or gallops. Brisk peripheral pulses without edema.  Chest: symmetric, with normal excursions and percussion.  Abdomen: Soft, nontender, no guarding, rebound, hernias, masses, or organomegaly.  Lymphatics: Non tender  without lymphadenopathy.  Genitourinary: defer Musculoskeletal: Full ROM all peripheral extremities,5/5 strength, and normal gait. Right elbow without swelling, warmth, or erythema. No tenderness at epicondyles, + tinel's sign and elbow flexion test, decreased sensation right 4th and 5th digit. Good grip, no appreciable weakness. Has atrophy at scar along forearm.  Skin: Warm, dry without rashes, lesions, ecchymosis. Neuro: Cranial nerves intact, reflexes equal bilaterally. Normal muscle tone, no cerebellar symptoms. Sensation intact.  Psych: Awake and oriented X 3, normal affect, Insight and Judgment appropriate.   EKG: declines AORTA SCAN: defer  Vicie Mutters 9:26 AM [facility]

## 2019-03-24 ENCOUNTER — Encounter: Payer: Self-pay | Admitting: Physician Assistant

## 2019-03-24 ENCOUNTER — Ambulatory Visit (INDEPENDENT_AMBULATORY_CARE_PROVIDER_SITE_OTHER): Payer: 59 | Admitting: Physician Assistant

## 2019-03-24 ENCOUNTER — Other Ambulatory Visit: Payer: Self-pay

## 2019-03-24 VITALS — BP 124/88 | HR 70 | Temp 97.3°F | Ht 71.0 in | Wt 201.0 lb

## 2019-03-24 DIAGNOSIS — Z0001 Encounter for general adult medical examination with abnormal findings: Secondary | ICD-10-CM

## 2019-03-24 DIAGNOSIS — Z79899 Other long term (current) drug therapy: Secondary | ICD-10-CM

## 2019-03-24 DIAGNOSIS — Z22322 Carrier or suspected carrier of Methicillin resistant Staphylococcus aureus: Secondary | ICD-10-CM

## 2019-03-24 DIAGNOSIS — G5621 Lesion of ulnar nerve, right upper limb: Secondary | ICD-10-CM

## 2019-03-24 DIAGNOSIS — Z1389 Encounter for screening for other disorder: Secondary | ICD-10-CM

## 2019-03-24 DIAGNOSIS — E559 Vitamin D deficiency, unspecified: Secondary | ICD-10-CM

## 2019-03-24 DIAGNOSIS — Z0000 Encounter for general adult medical examination without abnormal findings: Secondary | ICD-10-CM

## 2019-03-24 DIAGNOSIS — Z6827 Body mass index (BMI) 27.0-27.9, adult: Secondary | ICD-10-CM

## 2019-03-24 DIAGNOSIS — E785 Hyperlipidemia, unspecified: Secondary | ICD-10-CM

## 2019-03-24 DIAGNOSIS — Z130 Encounter for screening for diseases of the blood and blood-forming organs and certain disorders involving the immune mechanism: Secondary | ICD-10-CM

## 2019-03-24 NOTE — Patient Instructions (Signed)
Your LDL could improve, ideally we want it under a 100.  Your LDL is the bad cholesterol that can lead to heart attack and stroke. To lower your number you can decrease your fatty foods, red meat, cheese, milk and increase fiber like whole grains and veggies. You can also add a fiber supplement like Citracel or Benefiber, these do not cause gas and bloating and are safe to use. Especially if you have a strong family history of heart disease or stroke or you have evidence of plaque on any imaging like a chest xray, we may discuss at your next office visit putting you on a medication to get your number below 100.    Cubital Tunnel Syndrome  Cubital tunnel syndrome is a condition that causes pain and weakness of the forearm and hand. It happens when one of the nerves that runs along the inside of the elbow joint (ulnar nerve) becomes irritated. This condition is usually caused by repeated arm motions that are done during sports or work-related activities. What are the causes? This condition may be caused by:  Increased pressure on the ulnar nerve at the elbow, arm, or forearm. This can result from: ? Irritation caused by repeated elbow bending. ? Poorly healed elbow fractures. ? Tumors in the elbow. These are usually noncancerous (benign). ? Scar tissue that develops in the elbow after an injury. ? Bony growths (spurs) near the ulnar nerve.  Stretching of the nerve due to loose elbow ligaments.  Trauma to the nerve at the elbow. What increases the risk? The following factors may make you more likely to develop this condition:  Doing manual labor that requires frequent bending of the elbow.  Playing sports that include repeated or strenuous throwing motions, such as baseball.  Playing contact sports, such as football or lacrosse.  Not warming up properly before activities.  Having diabetes.  Having an underactive thyroid (hypothyroidism). What are the signs or symptoms? Symptoms of this  condition include:  Clumsiness or weakness of the hand.  Tenderness of the inner elbow.  Aching or soreness of the inner elbow, forearm, or fingers, especially the little finger or the ring finger.  Increased pain when forcing the elbow to bend.  Reduced control when throwing objects.  Tingling, numbness, or a burning feeling inside the forearm or in part of the hand or fingers, especially the little finger or the ring finger.  Sharp pains that shoot from the elbow down to the wrist and hand.  The inability to grip or pinch hard. How is this diagnosed? This condition is diagnosed based on:  Your symptoms and medical history. Your health care provider will also ask for details about any injury.  A physical exam. You may also have tests, including:  Electromyogram (EMG). This test measures electrical signals sent by your nerves into the muscles.  Nerve conduction study. This test measures how well electrical signals pass through your nerves.  Imaging tests, such as X-rays, ultrasound, and MRI. These tests check for possible causes of your condition. How is this treated? This condition may be treated by:  Stopping the activities that are causing your symptoms to get worse.  Icing and taking medicines to reduce pain and swelling.  Wearing a splint to prevent your elbow from bending, or wearing an elbow pad where the ulnar nerve is closest to the skin.  Working with a physical therapist in less severe cases. This may help to: ? Decrease your symptoms. ? Improve the strength and range  of motion of your elbow, forearm, and hand. If these treatments do not help, surgery may be needed. Follow these instructions at home: If you have a splint:  Wear the splint as told by your health care provider. Remove it only as told by your health care provider.  Loosen the splint if your fingers tingle, become numb, or turn cold and blue.  Keep the splint clean.  If the splint is not  waterproof: ? Do not let it get wet. ? Cover it with a watertight covering when you take a bath or shower. Managing pain, stiffness, and swelling   If directed, put ice on the injured area: ? Put ice in a plastic bag. ? Place a towel between your skin and the bag. ? Leave the ice on for 20 minutes, 2-3 times a day.  Move your fingers often to avoid stiffness and to lessen swelling.  Raise (elevate) the injured area above the level of your heart while you are sitting or lying down. General instructions  Take over-the-counter and prescription medicines only as told by your health care provider.  Do any exercise or physical therapy as told by your health care provider.  Do not drive or use heavy machinery while taking prescription pain medicine.  If you were given an elbow pad, wear it as told by your health care provider.  Keep all follow-up visits as told by your health care provider. This is important. Contact a health care provider if:  Your symptoms get worse.  Your symptoms do not get better with treatment.  You have new pain.  Your hand on the injured side feels numb or cold. Summary  Cubital tunnel syndrome is a condition that causes pain and weakness of the forearm and hand.  You are more likely to develop this condition if you do work or play sports that involve repeated arm movements.  This condition is often treated by stopping repetitive activities, applying ice, and using anti-inflammatory medicines.  In rare cases, surgery may be needed. This information is not intended to replace advice given to you by your health care provider. Make sure you discuss any questions you have with your health care provider. Document Released: 05/06/2005 Document Revised: 09/22/2017 Document Reviewed: 09/22/2017 Elsevier Patient Education  2020 [employer].

## 2019-03-25 LAB — COMPLETE METABOLIC PANEL WITH GFR
AG Ratio: 2 (calc) (ref 1.0–2.5)
ALT: 67 U/L — ABNORMAL HIGH (ref 9–46)
AST: 33 U/L (ref 10–40)
Albumin: 4.7 g/dL (ref 3.6–5.1)
Alkaline phosphatase (APISO): 53 U/L (ref 36–130)
BUN: 12 mg/dL (ref 7–25)
CO2: 27 mmol/L (ref 20–32)
Calcium: 9.6 mg/dL (ref 8.6–10.3)
Chloride: 100 mmol/L (ref 98–110)
Creat: 1.03 mg/dL (ref 0.60–1.35)
GFR, Est African American: 102 mL/min/{1.73_m2} (ref >=60)
GFR, Est Non African American: 88 mL/min/{1.73_m2} (ref >=60)
Globulin: 2.3 g/dL (calc) (ref 1.9–3.7)
Glucose, Bld: 109 mg/dL — ABNORMAL HIGH (ref 65–99)
Potassium: 4.2 mmol/L (ref 3.5–5.3)
Sodium: 139 mmol/L (ref 135–146)
Total Bilirubin: 0.6 mg/dL (ref 0.2–1.2)
Total Protein: 7 g/dL (ref 6.1–8.1)

## 2019-03-25 LAB — CBC WITH DIFFERENTIAL/PLATELET
Absolute Monocytes: 382 cells/uL (ref 200–950)
Basophils Absolute: 20 cells/uL (ref 0–200)
Basophils Relative: 0.5 %
Eosinophils Absolute: 109 cells/uL (ref 15–500)
Eosinophils Relative: 2.8 %
HCT: 46.4 % (ref 38.5–50.0)
Hemoglobin: 15.9 g/dL (ref 13.2–17.1)
Lymphs Abs: 1689 cells/uL (ref 850–3900)
MCH: 30.5 pg (ref 27.0–33.0)
MCHC: 34.3 g/dL (ref 32.0–36.0)
MCV: 89.1 fL (ref 80.0–100.0)
MPV: 11.2 fL (ref 7.5–12.5)
Monocytes Relative: 9.8 %
Neutro Abs: 1700 cells/uL (ref 1500–7800)
Neutrophils Relative %: 43.6 %
Platelets: 195 10*3/uL (ref 140–400)
RBC: 5.21 10*6/uL (ref 4.20–5.80)
RDW: 13 % (ref 11.0–15.0)
Total Lymphocyte: 43.3 %
WBC: 3.9 10*3/uL (ref 3.8–10.8)

## 2019-03-25 LAB — URINALYSIS, ROUTINE W REFLEX MICROSCOPIC
Bilirubin Urine: NEGATIVE
Glucose, UA: NEGATIVE
Hgb urine dipstick: NEGATIVE
Ketones, ur: NEGATIVE
Leukocytes,Ua: NEGATIVE
Nitrite: NEGATIVE
Protein, ur: NEGATIVE
Specific Gravity, Urine: 1.011 (ref 1.001–1.03)
pH: 7 (ref 5.0–8.0)

## 2019-03-25 LAB — LIPID PANEL
Cholesterol: 184 mg/dL (ref <200)
HDL: 43 mg/dL (ref >=40)
LDL Cholesterol (Calc): 108 mg/dL (calc) — ABNORMAL HIGH
Non-HDL Cholesterol (Calc): 141 mg/dL (calc) — ABNORMAL HIGH (ref <130)
Total CHOL/HDL Ratio: 4.3 (calc) (ref <5.0)
Triglycerides: 221 mg/dL — ABNORMAL HIGH (ref <150)

## 2019-03-25 LAB — MICROALBUMIN / CREATININE URINE RATIO
Creatinine, Urine: 71 mg/dL (ref 20–320)
Microalb Creat Ratio: 3 mcg/mg creat (ref <30)
Microalb, Ur: 0.2 mg/dL

## 2019-03-25 LAB — VITAMIN D 25 HYDROXY (VIT D DEFICIENCY, FRACTURES): Vit D, 25-Hydroxy: 29 ng/mL — ABNORMAL LOW (ref 30–100)

## 2019-03-25 LAB — TSH: TSH: 1.91 mIU/L (ref 0.40–4.50)

## 2019-03-25 LAB — MAGNESIUM: Magnesium: 2.1 mg/dL (ref 1.5–2.5)

## 2019-07-13 ENCOUNTER — Other Ambulatory Visit: Payer: Self-pay | Admitting: Physician Assistant

## 2019-07-13 DIAGNOSIS — E785 Hyperlipidemia, unspecified: Secondary | ICD-10-CM

## 2019-12-02 ENCOUNTER — Other Ambulatory Visit: Payer: Self-pay | Admitting: Physician Assistant

## 2019-12-02 DIAGNOSIS — M722 Plantar fascial fibromatosis: Secondary | ICD-10-CM

## 2020-03-23 ENCOUNTER — Encounter: Payer: Self-pay | Admitting: Adult Health Nurse Practitioner

## 2020-03-23 ENCOUNTER — Other Ambulatory Visit: Payer: Self-pay

## 2020-03-23 ENCOUNTER — Ambulatory Visit (INDEPENDENT_AMBULATORY_CARE_PROVIDER_SITE_OTHER): Payer: 59 | Admitting: Adult Health Nurse Practitioner

## 2020-03-23 VITALS — BP 118/76 | HR 69 | Temp 97.3°F | Ht 71.0 in | Wt 201.0 lb

## 2020-03-23 DIAGNOSIS — Z0001 Encounter for general adult medical examination with abnormal findings: Secondary | ICD-10-CM

## 2020-03-23 DIAGNOSIS — Z6827 Body mass index (BMI) 27.0-27.9, adult: Secondary | ICD-10-CM | POA: Diagnosis not present

## 2020-03-23 DIAGNOSIS — Z79899 Other long term (current) drug therapy: Secondary | ICD-10-CM

## 2020-03-23 DIAGNOSIS — M722 Plantar fascial fibromatosis: Secondary | ICD-10-CM

## 2020-03-23 DIAGNOSIS — Z136 Encounter for screening for cardiovascular disorders: Secondary | ICD-10-CM

## 2020-03-23 DIAGNOSIS — Z1321 Encounter for screening for nutritional disorder: Secondary | ICD-10-CM

## 2020-03-23 DIAGNOSIS — Z131 Encounter for screening for diabetes mellitus: Secondary | ICD-10-CM

## 2020-03-23 DIAGNOSIS — Z1329 Encounter for screening for other suspected endocrine disorder: Secondary | ICD-10-CM

## 2020-03-23 DIAGNOSIS — Z2821 Immunization not carried out because of patient refusal: Secondary | ICD-10-CM

## 2020-03-23 DIAGNOSIS — Z130 Encounter for screening for diseases of the blood and blood-forming organs and certain disorders involving the immune mechanism: Secondary | ICD-10-CM

## 2020-03-23 DIAGNOSIS — E785 Hyperlipidemia, unspecified: Secondary | ICD-10-CM | POA: Diagnosis not present

## 2020-03-23 DIAGNOSIS — Z1389 Encounter for screening for other disorder: Secondary | ICD-10-CM

## 2020-03-23 DIAGNOSIS — R519 Headache, unspecified: Secondary | ICD-10-CM | POA: Diagnosis not present

## 2020-03-23 DIAGNOSIS — G5621 Lesion of ulnar nerve, right upper limb: Secondary | ICD-10-CM | POA: Diagnosis not present

## 2020-03-23 DIAGNOSIS — E559 Vitamin D deficiency, unspecified: Secondary | ICD-10-CM

## 2020-03-23 NOTE — Patient Instructions (Addendum)
You are due for an eye exam.  [facility]         Phone: [phone] - [address] [place], [place] [address]   [facility]    Phone:  [phone]  [address]  [place] [address]    Try taking your cholesterol medication with dinner to reduce water intake before going to sleep.      GENERAL HEALTH GOALS  Know what a healthy weight is for you (roughly BMI <25) and aim to maintain this  Aim for 7+ servings of fruits and vegetables daily  70-80+ fluid ounces of water or unsweet tea for healthy kidneys  Limit to max 1 drink of alcohol per day; avoid smoking/tobacco  Limit animal fats in diet for cholesterol and heart health - choose grass fed whenever available  Avoid highly processed foods, and foods high in saturated/trans fats  Aim for low stress - take time to unwind and care for your mental health  Aim for 150 min of moderate intensity exercise weekly for heart health, and weights twice weekly for bone health  Aim for 7-9 hours of sleep daily

## 2020-03-23 NOTE — Progress Notes (Signed)
COMPLETE PHYSICAL   Assessment and Plan:   Diagnoses and all orders for this visit:  Encounter for general adult medical examination with abnormal findings Yearly -     CBC with Differential/Platelet -     COMPLETE METABOLIC PANEL WITH GFR  Hyperlipidemia, unspecified hyperlipidemia type Continue medications: Atorvastatin 20mg  nightly Discussed dietary and exercise modifications Low fat diet -     Lipid panel  Plantar fasciitis Managing well at this time He has meloxicam 15mg  PRN  Cubital tunnel syndrome on right Doing well at this time  BMI 27.0-27.9,adult Discussed dietary and exercise modifications  Vitamin D deficiency Continue supplementation to maintain goal of 70-100 Not taking Vitamin D  - vitamin D level  Acute intractable headache, unspecified headache type Doing well at this time Continue to monitor   Screening, anemia, deficiency, iron -     Iron,Total/Total Iron Binding Cap  Screening for blood or protein in urine -     Urinalysis w microscopic + reflex cultur  Screening for cardiovascular condition -     EKG 12-Lead  Medication management Continued  Screening for thyroid disorder -     TSH  Encounter for vitamin deficiency screening -     Magnesium -     VITAMIN D 25 Hydroxy (Vit-D Deficiency, Fractures) -     Vitamin B12  Screening for diabetes mellitus -     Hemoglobin A1c  Influenza vaccination declined Discussed with patient  . Further disposition pending results if labs check today. Discussed med's effects and SE's.   Over 30 minutes of face to face interview, exam, counseling, chart review, and critical decision making was performed.    Future Appointments  Date Time Provider Department Center  03/26/2021  9:00 AM Ciro Tashiro, 06-23-1971, NP [facility] None     HPI 44 y.o. male  presents for 3 month follow up on HTN, HLD prediabetes, and vitamin D deficiency.   He reports he does not have any health or medication concerns  today.  He was treated conservatively at visit last year for plantar fasciitis, he is still doing stretches, arch support in his work shoes. This has helped to resolve his symptoms and he has not had difficulty since then.  He has meloxicam PRN and reports he has not had to use this.  His blood pressure has been controlled at home, today their BP is BP: 118/76   He does not workout, active work. He denies chest pain, shortness of breath, dizziness.   He  is  on cholesterol medication, is on lipitor 20mg  started last visit and denies myalgias. His cholesterol is not at goal. The cholesterol last visit was:   Lab Results  Component Value Date   CHOL 184 03/24/2019   HDL 43 03/24/2019   LDLCALC 108 (H) 03/24/2019   TRIG 221 (H) 03/24/2019   CHOLHDL 4.3 03/24/2019   Patient is on not on Vitamin D supplement at this time.  He was not to goal last check.   Lab Results  Component Value Date   VD25OH 29 (L) 03/24/2019     BMI is Body mass index is 28.03 kg/m., he is working on diet and exercise. Wt Readings from Last 3 Encounters:  03/23/20 201 lb (91.2 kg)  03/24/19 201 lb (91.2 kg)  02/01/19 195 lb 3.2 oz (88.5 kg)     Current Medications:  Current Outpatient Medications on File Prior to Visit  Medication Sig  . atorvastatin (LIPITOR) 20 MG tablet TAKE 1 TABLET BY MOUTH  ONCE DAILY AT BEDTIME  . meloxicam (MOBIC) 15 MG tablet TAKE 1 TABLET BY MOUTH DAILY WITH FOOD FOR 4 WEEKS. CAN TAKE WITH TYLENOL. CANNOT TAKE WITH ALEVE, IBUPROFEN. THEN AS NEEDED DAILY FOR PAIN   No current facility-administered medications on file prior to visit.    Medical History:  Past Medical History:  Diagnosis Date  . Hyperlipidemia 03/18/2017   Allergies: No Known Allergies   Review of Systems:  Review of Systems  Constitutional: Negative for chills, diaphoresis, fever, malaise/fatigue and weight loss.  HENT: Negative for congestion, ear discharge, ear pain, hearing loss, nosebleeds, sinus pain,  sore throat and tinnitus.   Eyes: Negative for blurred vision, double vision, photophobia, pain, discharge and redness.  Respiratory: Negative for cough, hemoptysis, sputum production, shortness of breath, wheezing and stridor.   Cardiovascular: Negative for chest pain, palpitations, orthopnea, claudication, leg swelling and PND.  Gastrointestinal: Negative for abdominal pain, blood in stool, constipation, diarrhea, heartburn, melena, nausea and vomiting.  Genitourinary: Negative for dysuria, flank pain, frequency, hematuria and urgency.  Musculoskeletal: Negative for back pain, falls, joint pain, myalgias and neck pain.  Skin: Negative for itching and rash.  Neurological: Negative for dizziness, tingling, tremors, sensory change, speech change, focal weakness, seizures, loss of consciousness, weakness and headaches.  Endo/Heme/Allergies: Negative for environmental allergies and polydipsia. Does not bruise/bleed easily.  Psychiatric/Behavioral: Negative for depression, hallucinations, memory loss, substance abuse and suicidal ideas. The patient is not nervous/anxious and does not have insomnia.     Family history- Review and unchanged Social history- Review and unchanged   Names of Other Physician/Practitioners you currently use: 1. [facility] here for primary care 2. Eye Exam, DUE  3.Scheduled 03/2020  Patient Care Team: [name], MD as PCP - General (Internal Medicine)    Screening Tests: Immunization History  Administered Date(s) Administered  . Influenza Inj Mdck Quad With Preservative 03/17/2017, 03/19/2018  . Tdap 03/17/2017    Preventative care: Last colonoscopy: Had one in 30's, cleared until 49.   Vaccinations: TD or Tdap: 2018  Influenza: Declined Pneumococcal: N/A Shingles/Zostavax: N/A   Physical Exam: BP 118/76   Pulse 69   Temp (!) 97.3 F (36.3 C)   Ht 5\' 11"  (1.803 m)   Wt 201 lb (91.2 kg)   SpO2 98%    BMI 28.03 kg/m  Wt Readings from Last 3 Encounters:  03/23/20 201 lb (91.2 kg)  03/24/19 201 lb (91.2 kg)  02/01/19 195 lb 3.2 oz (88.5 kg)   General Appearance: Well nourished, in no apparent distress. Eyes: PERRLA, EOMs, conjunctiva no swelling or erythema Sinuses: No Frontal/maxillary tenderness ENT/Mouth: Ext aud canals clear, TMs without erythema, bulging. No erythema, swelling, or exudate on post pharynx.  Tonsils not swollen or erythematous. Hearing normal.  Neck: Supple, thyroid normal.  Respiratory: Respiratory effort normal, BS equal bilaterally without rales, rhonchi, wheezing or stridor.  Cardio: RRR with no MRGs. Brisk peripheral pulses without edema.  Abdomen: Soft, + BS,  Non tender, no guarding, rebound, hernias, masses. Lymphatics: Non tender without lymphadenopathy.  Musculoskeletal: Full ROM, 5/5 strength, Normal gait. Foot exam reveals minimal point tenderness over the inferior aspect of right heel, without masses, deformity or edema.  The rest of the foot and ankle exam is normal. Color and temperature of the feet is normal. Peripheral pulses are normal.  Skin: Warm, dry without rashes, lesions, ecchymosis.  Neuro: Cranial nerves intact. Normal muscle tone, no cerebellar symptoms. Psych: Awake and oriented X 3, normal affect,  Insight and Judgment appropriate.   EKG: NSR   Loni Dolly, DNP [facility] 03/23/2020  9:45 AM

## 2020-03-24 ENCOUNTER — Other Ambulatory Visit: Payer: Self-pay | Admitting: Adult Health Nurse Practitioner

## 2020-03-24 DIAGNOSIS — E559 Vitamin D deficiency, unspecified: Secondary | ICD-10-CM

## 2020-03-24 LAB — COMPLETE METABOLIC PANEL WITH GFR
AG Ratio: 2 (calc) (ref 1.0–2.5)
ALT: 56 U/L — ABNORMAL HIGH (ref 9–46)
AST: 29 U/L (ref 10–40)
Albumin: 4.7 g/dL (ref 3.6–5.1)
Alkaline phosphatase (APISO): 66 U/L (ref 36–130)
BUN: 16 mg/dL (ref 7–25)
CO2: 31 mmol/L (ref 20–32)
Calcium: 10.1 mg/dL (ref 8.6–10.3)
Chloride: 101 mmol/L (ref 98–110)
Creat: 0.98 mg/dL (ref 0.60–1.35)
GFR, Est African American: 107 mL/min/{1.73_m2} (ref >=60)
GFR, Est Non African American: 93 mL/min/{1.73_m2} (ref >=60)
Globulin: 2.4 g/dL (calc) (ref 1.9–3.7)
Glucose, Bld: 92 mg/dL (ref 65–99)
Potassium: 4.5 mmol/L (ref 3.5–5.3)
Sodium: 140 mmol/L (ref 135–146)
Total Bilirubin: 0.6 mg/dL (ref 0.2–1.2)
Total Protein: 7.1 g/dL (ref 6.1–8.1)

## 2020-03-24 LAB — CBC WITH DIFFERENTIAL/PLATELET
Absolute Monocytes: 432 cells/uL (ref 200–950)
Basophils Absolute: 19 cells/uL (ref 0–200)
Basophils Relative: 0.4 %
Eosinophils Absolute: 101 cells/uL (ref 15–500)
Eosinophils Relative: 2.1 %
HCT: 48.8 % (ref 38.5–50.0)
Hemoglobin: 16.9 g/dL (ref 13.2–17.1)
Lymphs Abs: 1776 cells/uL (ref 850–3900)
MCH: 30.7 pg (ref 27.0–33.0)
MCHC: 34.6 g/dL (ref 32.0–36.0)
MCV: 88.6 fL (ref 80.0–100.0)
MPV: 11.1 fL (ref 7.5–12.5)
Monocytes Relative: 9 %
Neutro Abs: 2472 cells/uL (ref 1500–7800)
Neutrophils Relative %: 51.5 %
Platelets: 216 10*3/uL (ref 140–400)
RBC: 5.51 10*6/uL (ref 4.20–5.80)
RDW: 13.1 % (ref 11.0–15.0)
Total Lymphocyte: 37 %
WBC: 4.8 10*3/uL (ref 3.8–10.8)

## 2020-03-24 LAB — LIPID PANEL
Cholesterol: 187 mg/dL (ref <200)
HDL: 45 mg/dL (ref >=40)
LDL Cholesterol (Calc): 110 mg/dL (calc) — ABNORMAL HIGH
Non-HDL Cholesterol (Calc): 142 mg/dL (calc) — ABNORMAL HIGH (ref <130)
Total CHOL/HDL Ratio: 4.2 (calc) (ref <5.0)
Triglycerides: 207 mg/dL — ABNORMAL HIGH (ref <150)

## 2020-03-24 LAB — VITAMIN D 25 HYDROXY (VIT D DEFICIENCY, FRACTURES): Vit D, 25-Hydroxy: 28 ng/mL — ABNORMAL LOW (ref 30–100)

## 2020-03-24 LAB — URINALYSIS W MICROSCOPIC + REFLEX CULTURE
Bacteria, UA: NONE SEEN /HPF
Bilirubin Urine: NEGATIVE
Glucose, UA: NEGATIVE
Hgb urine dipstick: NEGATIVE
Hyaline Cast: NONE SEEN /LPF
Ketones, ur: NEGATIVE
Leukocyte Esterase: NEGATIVE
Nitrites, Initial: NEGATIVE
Protein, ur: NEGATIVE
RBC / HPF: NONE SEEN /HPF (ref 0–2)
Specific Gravity, Urine: 1.011 (ref 1.001–1.03)
Squamous Epithelial / HPF: NONE SEEN /HPF (ref <=5)
WBC, UA: NONE SEEN /HPF (ref 0–5)
pH: 7 (ref 5.0–8.0)

## 2020-03-24 LAB — VITAMIN B12: Vitamin B-12: 471 pg/mL (ref 200–1100)

## 2020-03-24 LAB — HEMOGLOBIN A1C
Hgb A1c MFr Bld: 5.4 % of total Hgb (ref <5.7)
Mean Plasma Glucose: 108 (calc)
eAG (mmol/L): 6 (calc)

## 2020-03-24 LAB — IRON, TOTAL/TOTAL IRON BINDING CAP
%SAT: 39 % (calc) (ref 20–48)
Iron: 138 ug/dL (ref 50–180)
TIBC: 358 mcg/dL (calc) (ref 250–425)

## 2020-03-24 LAB — MAGNESIUM: Magnesium: 2.2 mg/dL (ref 1.5–2.5)

## 2020-03-24 LAB — TSH: TSH: 1.96 mIU/L (ref 0.40–4.50)

## 2020-03-24 LAB — NO CULTURE INDICATED

## 2020-03-24 MED ORDER — CHOLECALCIFEROL 1.25 MG (50000 UT) PO CAPS: ORAL_CAPSULE | ORAL | 0 refills | Status: DC

## 2020-04-14 DIAGNOSIS — E785 Hyperlipidemia, unspecified: Secondary | ICD-10-CM

## 2020-04-16 MED ORDER — ATORVASTATIN CALCIUM 20 MG PO TABS: ORAL_TABLET | ORAL | 1 refills | Status: DC

## 2020-09-20 ENCOUNTER — Ambulatory Visit: Payer: 59 | Admitting: Adult Health Nurse Practitioner

## 2020-09-22 ENCOUNTER — Encounter: Payer: Self-pay | Admitting: Adult Health

## 2020-09-22 NOTE — Progress Notes (Signed)
FOLLOW UP  Assessment and Plan:   Cholesterol Currently above goal; working on lifestyle LDL goal <100 Discussed low saturated fat, increase soluble fiber  Continue low cholesterol diet and exercise.  - lipid panel - TSH - CMP/GFR  Overweight with comorbidities - BMi 26 Long discussion about weight loss, diet, and exercise Recommended diet heavy in fruits and veggies and low in animal meats, cheeses, and dairy products, appropriate calorie intake Discussed ideal weight for height  Patient will work on continue to reduce processed foods, fatty foods, increase high fiber food portions Will follow up in 3 months  Vitamin D Def Below goal at last visit; admits not supplementing Recommended supplementation for goal of 60-100 Get on 5000 IU daily Defer Vit D level to CPE  LFT elevation Mild stable elevation; recheck today has been working on reducing processed carbohydrate and weight is down, may improve if fatty liver Check [place] if persistent/trending up, hepatitis panel  - CMP/GFR  ? Acid reflux Silent, possible cause of ulcers  Has tapered off without recurrence' Discussed famotidine as better long term option if needed - sent in to hold Discuss with GI, will need screening colonoscopy this year, consider EGD off of reflux meds to confirm   Continue diet and meds as discussed. Further disposition pending results of labs. Discussed med's effects and SE's.   Over 30 minutes of exam, counseling, chart review, and critical decision making was performed.   Future Appointments  Date Time Provider Department Center  03/26/2021  9:00 AM McClanahan, Bella Kennedy, NP [facility] None    ----------------------------------------------------------------------------------------------------------------------  HPI 45 y.o. male  presents for 6 month follow up on cholesterol, weight and vitamin D deficiency.   He reports had some mouth ulcers, dentist advised nexium and took for several months  and resolved, never had reflux sx, has been off of med for 1 month.   BMI is Body mass index is 26.92 kg/m., he has been working on diet (has reduced sugar intake significantly) and works in [employer], very active.  Wt Readings from Last 3 Encounters:  09/25/20 193 lb (87.5 kg)  03/23/20 201 lb (91.2 kg)  03/24/19 201 lb (91.2 kg)   Today their BP is BP: 122/88  He does not workout. He denies chest pain, shortness of breath, dizziness.   He is on cholesterol medication Atorvastatin 20 mg daily and denies myalgias. His cholesterol is not at goal. The cholesterol last visit was:   Lab Results  Component Value Date   CHOL 187 03/23/2020   HDL 45 03/23/2020   LDLCALC 110 (H) 03/23/2020   TRIG 207 (H) 03/23/2020   CHOLHDL 4.2 03/23/2020    He has been working on diet and exercise for glucose management, and denies increased appetite, nausea, paresthesia of the feet, polydipsia, polyuria and visual disturbances. Last A1C in the office was:  Lab Results  Component Value Date   HGBA1C 5.4 03/23/2020   Patient admits is not taking vitamin D supplement per last recommendations. Receptive to starting -  Lab Results  Component Value Date   VD25OH 28 (L) 03/23/2020     He has had mild persistent ALT elevation , normal serum iron 03/2020, no ferritin, hasn't had hep C check or [place], has worked on reducing processed carbohydrate and diet for possible fatty liver Lab Results  Component Value Date   ALT 56 (H) 03/23/2020   AST 29 03/23/2020   BILITOT 0.6 03/23/2020      Current Medications:  Current Outpatient Medications on  File Prior to Visit  Medication Sig  . atorvastatin (LIPITOR) 20 MG tablet Take one tablet at bedtime to lower cholesterol.  . meloxicam (MOBIC) 15 MG tablet TAKE 1 TABLET BY MOUTH DAILY WITH FOOD FOR 4 WEEKS. CAN TAKE WITH TYLENOL. CANNOT TAKE WITH ALEVE, IBUPROFEN. THEN AS NEEDED DAILY FOR PAIN (Patient taking differently: as needed. TAKE 1 TABLET BY MOUTH DAILY  WITH FOOD FOR 4 WEEKS. CAN TAKE WITH TYLENOL. CANNOT TAKE WITH ALEVE, IBUPROFEN. THEN AS NEEDED DAILY FOR PAIN)  . Cholecalciferol 1.25 MG (50000 UT) capsule Take one tablet by mouth three days a week for twelve weeks. (Patient not taking: Reported on 09/25/2020)   No current facility-administered medications on file prior to visit.     Allergies: No Known Allergies   Medical History:  Past Medical History:  Diagnosis Date  . Hyperlipidemia 03/18/2017  . MRSA colonization 03/17/2017   Treated x 2 times   Family history- Reviewed and unchanged Social history- Reviewed and unchanged   Review of Systems:  Review of Systems  Constitutional: Negative for malaise/fatigue and weight loss.  HENT: Negative for hearing loss and tinnitus.   Eyes: Negative for blurred vision and double vision.  Respiratory: Negative for cough, shortness of breath and wheezing.   Cardiovascular: Negative for chest pain, palpitations, orthopnea, claudication and leg swelling.  Gastrointestinal: Negative for abdominal pain, blood in stool, constipation, diarrhea, heartburn, melena, nausea and vomiting.  Genitourinary: Negative.   Musculoskeletal: Negative for joint pain and myalgias.  Skin: Negative for rash.  Neurological: Negative for dizziness, tingling, sensory change, weakness and headaches.  Endo/Heme/Allergies: Negative for polydipsia.  Psychiatric/Behavioral: Negative.   All other systems reviewed and are negative.     Physical Exam: BP 122/88   Pulse 75   Temp (!) 97.5 F (36.4 C)   Wt 193 lb (87.5 kg)   SpO2 96%   BMI 26.92 kg/m  Wt Readings from Last 3 Encounters:  09/25/20 193 lb (87.5 kg)  03/23/20 201 lb (91.2 kg)  03/24/19 201 lb (91.2 kg)   General Appearance: Well nourished, in no apparent distress. Eyes: PERRLA, EOMs, conjunctiva no swelling or erythema Sinuses: No Frontal/maxillary tenderness ENT/Mouth: Ext aud canals clear, TMs without erythema, bulging. No erythema,  swelling, or exudate on post pharynx.  Tonsils not swollen or erythematous. Hearing normal.  Neck: Supple, thyroid normal.  Respiratory: Respiratory effort normal, BS equal bilaterally without rales, rhonchi, wheezing or stridor.  Cardio: RRR with no MRGs. Brisk peripheral pulses without edema.  Abdomen: Soft, + BS.  Non tender, no guarding, rebound, hernias, masses. Lymphatics: Non tender without lymphadenopathy.  Musculoskeletal: Full ROM, 5/5 strength, Normal gait Skin: Warm, dry without rashes, lesions, ecchymosis.  Neuro: Cranial nerves intact. No cerebellar symptoms.  Psych: Awake and oriented X 3, normal affect, Insight and Judgment appropriate.    Dan Maker, NP 4:28 PM [facility]

## 2020-09-25 ENCOUNTER — Encounter: Payer: Self-pay | Admitting: Adult Health

## 2020-09-25 ENCOUNTER — Other Ambulatory Visit: Payer: Self-pay

## 2020-09-25 ENCOUNTER — Ambulatory Visit (INDEPENDENT_AMBULATORY_CARE_PROVIDER_SITE_OTHER): Payer: BC Managed Care – PPO | Admitting: Adult Health

## 2020-09-25 VITALS — BP 122/88 | HR 75 | Temp 97.5°F | Wt 193.0 lb

## 2020-09-25 DIAGNOSIS — E559 Vitamin D deficiency, unspecified: Secondary | ICD-10-CM

## 2020-09-25 DIAGNOSIS — R7989 Other specified abnormal findings of blood chemistry: Secondary | ICD-10-CM | POA: Diagnosis not present

## 2020-09-25 DIAGNOSIS — Z6827 Body mass index (BMI) 27.0-27.9, adult: Secondary | ICD-10-CM | POA: Diagnosis not present

## 2020-09-25 DIAGNOSIS — E785 Hyperlipidemia, unspecified: Secondary | ICD-10-CM | POA: Diagnosis not present

## 2020-09-25 DIAGNOSIS — Z79899 Other long term (current) drug therapy: Secondary | ICD-10-CM

## 2020-09-25 MED ORDER — FAMOTIDINE 20 MG PO TABS: 20.0000 mg | ORAL_TABLET | Freq: Every day | ORAL | 0 refills | Status: DC

## 2020-09-25 NOTE — Patient Instructions (Addendum)
Goals    . LDL CALC < 100        Recommend getting on vitamin D 5000 IU daily - take daily with atorvastatin   Recommend reducing saturated fat (high in red meat/pork, butter, cheese, coconut oil, crisco, lard, etc)   Try to choose high polyunsaturated fat sources (olive oil, nuts, seeds - chia, ground flax, avocado)  Choose high fiber options when available - whole grain bread, old fashioned oats instead of instant oats, etc - small changes add up  Beans are excellent for fiber - try to work up to a serving daily  Avoid large/fatty meals at night, sit upright for 4 hours prior to lying back, increasing fiber can help reflux in the long term  Try famotidine at night instead of nexium - less side effects long term  Discuss possible EGD with GI       High-Fiber Eating Plan Fiber, also called dietary fiber, is a type of carbohydrate. It is found foods such as fruits, vegetables, whole grains, and beans. A high-fiber diet can have many health benefits. Your health care provider may recommend a high-fiber diet to help:  Prevent constipation. Fiber can make your bowel movements more regular.  Lower your cholesterol.  Relieve the following conditions: ? Inflammation of veins in the anus (hemorrhoids). ? Inflammation of specific areas of the digestive tract (uncomplicated diverticulosis). ? A problem of the large intestine, also called the colon, that sometimes causes pain and diarrhea (irritable bowel syndrome, or IBS).  Prevent overeating as part of a weight-loss plan.  Prevent heart disease, type 2 diabetes, and certain cancers. What are tips for following this plan? Reading food labels  Check the nutrition facts label on food products for the amount of dietary fiber. Choose foods that have 5 grams of fiber or more per serving.  The goals for recommended daily fiber intake include: ? Men (age 82 or younger): 34-38 g. ? Men (over age 30): 28-34 g. ? Women (age 34 or  younger): 25-28 g. ? Women (over age 53): 22-25 g. Your daily fiber goal is _____________ g.   Shopping  Choose whole fruits and vegetables instead of processed forms, such as apple juice or applesauce.  Choose a wide variety of high-fiber foods such as avocados, lentils, oats, and kidney beans.  Read the nutrition facts label of the foods you choose. Be aware of foods with added fiber. These foods often have high sugar and sodium amounts per serving. Cooking  Use whole-grain flour for baking and cooking.  Cook with brown rice instead of white rice. Meal planning  Start the day with a breakfast that is high in fiber, such as a cereal that contains 5 g of fiber or more per serving.  Eat breads and cereals that are made with whole-grain flour instead of refined flour or white flour.  Eat brown rice, bulgur wheat, or millet instead of white rice.  Use beans in place of meat in soups, salads, and pasta dishes.  Be sure that half of the grains you eat each day are whole grains. General information  You can get the recommended daily intake of dietary fiber by: ? Eating a variety of fruits, vegetables, grains, nuts, and beans. ? Taking a fiber supplement if you are not able to take in enough fiber in your diet. It is better to get fiber through food than from a supplement.  Gradually increase how much fiber you consume. If you increase your intake of dietary fiber  too quickly, you may have bloating, cramping, or gas.  Drink plenty of water to help you digest fiber.  Choose high-fiber snacks, such as berries, raw vegetables, nuts, and popcorn. What foods should I eat? Fruits Berries. Pears. Apples. Oranges. Avocado. Prunes and raisins. Dried figs. Vegetables Sweet potatoes. Spinach. Kale. Artichokes. Cabbage. Broccoli. Cauliflower. Green peas. Carrots. Squash. Grains Whole-grain breads. Multigrain cereal. Oats and oatmeal. Brown rice. Barley. Bulgur wheat. Millet. Quinoa. Bran  muffins. Popcorn. Rye wafer crackers. Meats and other proteins Navy beans, kidney beans, and pinto beans. Soybeans. Split peas. Lentils. Nuts and seeds. Dairy Fiber-fortified yogurt. Beverages Fiber-fortified soy milk. Fiber-fortified orange juice. Other foods Fiber bars. The items listed above may not be a complete list of recommended foods and beverages. Contact a dietitian for more information. What foods should I avoid? Fruits Fruit juice. Cooked, strained fruit. Vegetables Fried potatoes. Canned vegetables. Well-cooked vegetables. Grains White bread. Pasta made with refined flour. White rice. Meats and other proteins Fatty cuts of meat. Fried chicken or fried fish. Dairy Milk. Yogurt. Cream cheese. Sour cream. Fats and oils Butters. Beverages Soft drinks. Other foods Cakes and pastries. The items listed above may not be a complete list of foods and beverages to avoid. Talk with your dietitian about what choices are best for you. Summary  Fiber is a type of carbohydrate. It is found in foods such as fruits, vegetables, whole grains, and beans.  A high-fiber diet has many benefits. It can help to prevent constipation, lower blood cholesterol, aid weight loss, and reduce your risk of heart disease, diabetes, and certain cancers.  Increase your intake of fiber gradually. Increasing fiber too quickly may cause cramping, bloating, and gas. Drink plenty of water while you increase the amount of fiber you consume.  The best sources of fiber include whole fruits and vegetables, whole grains, nuts, seeds, and beans. This information is not intended to replace advice given to you by your health care provider. Make sure you discuss any questions you have with your health care provider. Document Revised: 09/09/2019 Document Reviewed: 09/09/2019 Elsevier Patient Education  2021 [employer].

## 2020-09-26 ENCOUNTER — Other Ambulatory Visit: Payer: Self-pay | Admitting: Adult Health

## 2020-09-26 DIAGNOSIS — E785 Hyperlipidemia, unspecified: Secondary | ICD-10-CM

## 2020-09-26 LAB — CBC WITH DIFFERENTIAL/PLATELET
Absolute Monocytes: 549 cells/uL (ref 200–950)
Basophils Absolute: 18 cells/uL (ref 0–200)
Basophils Relative: 0.3 %
Eosinophils Absolute: 112 cells/uL (ref 15–500)
Eosinophils Relative: 1.9 %
HCT: 45.7 % (ref 38.5–50.0)
Hemoglobin: 15.7 g/dL (ref 13.2–17.1)
Lymphs Abs: 2289 cells/uL (ref 850–3900)
MCH: 30 pg (ref 27.0–33.0)
MCHC: 34.4 g/dL (ref 32.0–36.0)
MCV: 87.4 fL (ref 80.0–100.0)
MPV: 11.6 fL (ref 7.5–12.5)
Monocytes Relative: 9.3 %
Neutro Abs: 2932 cells/uL (ref 1500–7800)
Neutrophils Relative %: 49.7 %
Platelets: 204 10*3/uL (ref 140–400)
RBC: 5.23 10*6/uL (ref 4.20–5.80)
RDW: 12.8 % (ref 11.0–15.0)
Total Lymphocyte: 38.8 %
WBC: 5.9 10*3/uL (ref 3.8–10.8)

## 2020-09-26 LAB — COMPLETE METABOLIC PANEL WITH GFR
AG Ratio: 1.8 (calc) (ref 1.0–2.5)
ALT: 37 U/L (ref 9–46)
AST: 21 U/L (ref 10–40)
Albumin: 4.6 g/dL (ref 3.6–5.1)
Alkaline phosphatase (APISO): 55 U/L (ref 36–130)
BUN: 18 mg/dL (ref 7–25)
CO2: 32 mmol/L (ref 20–32)
Calcium: 9.7 mg/dL (ref 8.6–10.3)
Chloride: 102 mmol/L (ref 98–110)
Creat: 1.03 mg/dL (ref 0.60–1.35)
GFR, Est African American: 101 mL/min/{1.73_m2} (ref >=60)
GFR, Est Non African American: 87 mL/min/{1.73_m2} (ref >=60)
Globulin: 2.5 g/dL (calc) (ref 1.9–3.7)
Glucose, Bld: 83 mg/dL (ref 65–99)
Potassium: 4.7 mmol/L (ref 3.5–5.3)
Sodium: 140 mmol/L (ref 135–146)
Total Bilirubin: 0.4 mg/dL (ref 0.2–1.2)
Total Protein: 7.1 g/dL (ref 6.1–8.1)

## 2020-09-26 LAB — LIPID PANEL
Cholesterol: 217 mg/dL — ABNORMAL HIGH (ref <200)
HDL: 46 mg/dL (ref >=40)
LDL Cholesterol (Calc): 138 mg/dL (calc) — ABNORMAL HIGH
Non-HDL Cholesterol (Calc): 171 mg/dL (calc) — ABNORMAL HIGH (ref <130)
Total CHOL/HDL Ratio: 4.7 (calc) (ref <5.0)
Triglycerides: 194 mg/dL — ABNORMAL HIGH (ref <150)

## 2020-09-26 LAB — TSH: TSH: 2.31 mIU/L (ref 0.40–4.50)

## 2020-09-26 MED ORDER — ATORVASTATIN CALCIUM 40 MG PO TABS: 40.0000 mg | ORAL_TABLET | Freq: Every day | ORAL | 1 refills | Status: DC

## 2021-03-21 NOTE — Progress Notes (Signed)
COMPLETE PHYSICAL   Assessment and Plan:   Diagnoses and all orders for this visit:  Encounter for general adult medical examination with abnormal findings Yearly -     CBC with Differential/Platelet -     COMPLETE METABOLIC PANEL WITH GFR  Hyperlipidemia, unspecified hyperlipidemia type Continue medications: Atorvastatin 20mg  nightly Discussed dietary and exercise modifications Low fat diet -     Lipid panel - TSH  GERD Continue Famotidine 20 mg QD  BMI 27.0-27.9,adult Discussed dietary and exercise modifications Has cut out sweet drinks  Vitamin D deficiency Continue supplementation to maintain goal of 70-100 Not taking Vitamin D  - vitamin D level  Blurred vision Refer to optometry for evaluation   SCREENING FOR ANEMIA CBC  Screening for blood or protein in urine -     Urinalysis w microscopic + reflex culture - Microalbumin/creatinine urine ratio  Screening for cardiovascular condition -     EKG 12-Lead  Medication management Continued  Screening for diabetes mellitus -     Hemoglobin A1c    . Further disposition pending results if labs check today. Discussed med's effects and SE's.   Over 30 minutes of face to face interview, exam, counseling, chart review, and critical decision making was performed.    Future Appointments  Date Time Provider Department Center  03/26/2022  9:00 AM 13/11/2021, NP [facility] None     HPI 45 y.o. male  presents for CPE has BMI 27.0-27.9,adult and Hyperlipidemia on their problem list.    He reports he does not have any health or medication concerns today.  His blood pressure has been controlled at home, today their BP is BP: 118/78  Has been having some blurred vision, it has been many years since he had an eye exam.  He does not workout, active work. He denies chest pain, shortness of breath, dizziness.  He  is  on cholesterol medication, is on lipitor 40mg  started last visit and denies myalgias. His  cholesterol is not at goal. The cholesterol last visit was:   Lab Results  Component Value Date   CHOL 217 (H) 09/25/2020   HDL 46 09/25/2020   LDLCALC 138 (H) 09/25/2020   TRIG 194 (H) 09/25/2020   CHOLHDL 4.7 09/25/2020   Patient is on not on Vitamin D supplement at this time.  He was not to goal last check.   Lab Results  Component Value Date   VD25OH 28 (L) 03/23/2020     BMI is Body mass index is 27.03 kg/m., he is working on diet and exercise. Is no longer drinking sweet drinks.  Wt Readings from Last 3 Encounters:  03/26/21 193 lb 12.8 oz (87.9 kg)  09/25/20 193 lb (87.5 kg)  03/23/20 201 lb (91.2 kg)     Current Medications:  Current Outpatient Medications on File Prior to Visit  Medication Sig   atorvastatin (LIPITOR) 40 MG tablet Take 1 tablet (40 mg total) by mouth daily.   famotidine (PEPCID) 20 MG tablet Take 1 tablet (20 mg total) by mouth at bedtime.   CHOLECALCIFEROL PO Take 5,000 Units by mouth daily. (Patient not taking: Reported on 03/26/2021)   meloxicam (MOBIC) 15 MG tablet TAKE 1 TABLET BY MOUTH DAILY WITH FOOD FOR 4 WEEKS. CAN TAKE WITH TYLENOL. CANNOT TAKE WITH ALEVE, IBUPROFEN. THEN AS NEEDED DAILY FOR PAIN (Patient not taking: Reported on 03/26/2021)   No current facility-administered medications on file prior to visit.    Medical History:  Past Medical History:  Diagnosis Date   Hyperlipidemia 03/18/2017   MRSA colonization 03/17/2017   Treated x 2 times   Allergies: No Known Allergies   Review of Systems:  Review of Systems  Constitutional:  Negative for chills and fever.  HENT:  Negative for congestion, hearing loss, sinus pain, sore throat and tinnitus.   Eyes:  Positive for blurred vision. Negative for double vision.  Respiratory:  Negative for cough, hemoptysis, sputum production, shortness of breath and wheezing.   Cardiovascular:  Negative for chest pain, palpitations and leg swelling.  Gastrointestinal:  Negative for abdominal pain,  constipation, diarrhea, heartburn, nausea and vomiting.  Genitourinary:  Negative for dysuria and urgency.  Musculoskeletal:  Negative for back pain, falls, joint pain, myalgias and neck pain.  Skin:  Negative for rash.  Neurological:  Negative for dizziness, tingling, tremors, weakness and headaches.  Endo/Heme/Allergies:  Does not bruise/bleed easily.  Psychiatric/Behavioral:  Negative for depression and suicidal ideas. The patient has insomnia. The patient is not nervous/anxious.    Family history- Review and unchanged Social history- Review and unchanged   Names of Other Physician/Practitioners you currently use: 1. [facility] here for primary care 2. Eye Exam, DUE will schedule 3. Dentist 2022  Patient Care Team: [name], MD as PCP - General (Internal Medicine)    Screening Tests: Immunization History  Administered Date(s) Administered   Influenza Inj Mdck Quad With Preservative 03/17/2017, 03/19/2018   Tdap 03/17/2017    Preventative care: Last colonoscopy: Had one in 30's, cleared until 49.   Vaccinations: TD or Tdap: 2018  Influenza: Declined Pneumococcal: N/A Shingles/Zostavax: N/A   Physical Exam: BP 118/78   Pulse 66   Temp 97.7 F (36.5 C)   Ht 5\' 11"  (1.803 m)   Wt 193 lb 12.8 oz (87.9 kg)   SpO2 97%   BMI 27.03 kg/m  Wt Readings from Last 3 Encounters:  03/26/21 193 lb 12.8 oz (87.9 kg)  09/25/20 193 lb (87.5 kg)  03/23/20 201 lb (91.2 kg)   General Appearance: Well nourished, in no apparent distress. Eyes: PERRLA, EOMs, conjunctiva no swelling or erythema Sinuses: No Frontal/maxillary tenderness ENT/Mouth: Ext aud canals clear, TMs without erythema, bulging. No erythema, swelling, or exudate on post pharynx.  Tonsils not swollen or erythematous. Hearing normal.  Neck: Supple, thyroid normal.  Respiratory: Respiratory effort normal, BS equal bilaterally without rales, rhonchi, wheezing or  stridor.  Cardio: RRR with no MRGs. Brisk peripheral pulses without edema.  Abdomen: Soft, + BS,  Non tender, no guarding, rebound, hernias, masses. Lymphatics: Non tender without lymphadenopathy.  Musculoskeletal: Full ROM, 5/5 strength, Normal gait. Foot exam reveals minimal point tenderness over the inferior aspect of right heel, without masses, deformity or edema.  The rest of the foot and ankle exam is normal. Color and temperature of the feet is normal. Peripheral pulses are normal.  Skin: Warm, dry without rashes, lesions, ecchymosis.  Neuro: Cranial nerves intact. Normal muscle tone, no cerebellar symptoms. Psych: Awake and oriented X 3, normal affect, Insight and Judgment appropriate.  GU: defer  EKG: IRBBB, No ST changes   13/04/21 [facility]  03/26/2021

## 2021-03-26 ENCOUNTER — Ambulatory Visit (INDEPENDENT_AMBULATORY_CARE_PROVIDER_SITE_OTHER): Payer: BC Managed Care – PPO | Admitting: Nurse Practitioner

## 2021-03-26 ENCOUNTER — Encounter: Payer: Self-pay | Admitting: Nurse Practitioner

## 2021-03-26 ENCOUNTER — Other Ambulatory Visit: Payer: Self-pay

## 2021-03-26 VITALS — BP 118/78 | HR 66 | Temp 97.7°F | Ht 71.0 in | Wt 193.8 lb

## 2021-03-26 DIAGNOSIS — Z1389 Encounter for screening for other disorder: Secondary | ICD-10-CM | POA: Diagnosis not present

## 2021-03-26 DIAGNOSIS — Z13220 Encounter for screening for lipoid disorders: Secondary | ICD-10-CM | POA: Diagnosis not present

## 2021-03-26 DIAGNOSIS — G5621 Lesion of ulnar nerve, right upper limb: Secondary | ICD-10-CM

## 2021-03-26 DIAGNOSIS — Z131 Encounter for screening for diabetes mellitus: Secondary | ICD-10-CM | POA: Diagnosis not present

## 2021-03-26 DIAGNOSIS — Z130 Encounter for screening for diseases of the blood and blood-forming organs and certain disorders involving the immune mechanism: Secondary | ICD-10-CM

## 2021-03-26 DIAGNOSIS — Z0001 Encounter for general adult medical examination with abnormal findings: Secondary | ICD-10-CM

## 2021-03-26 DIAGNOSIS — E785 Hyperlipidemia, unspecified: Secondary | ICD-10-CM

## 2021-03-26 DIAGNOSIS — I10 Essential (primary) hypertension: Secondary | ICD-10-CM

## 2021-03-26 DIAGNOSIS — K219 Gastro-esophageal reflux disease without esophagitis: Secondary | ICD-10-CM

## 2021-03-26 DIAGNOSIS — Z79899 Other long term (current) drug therapy: Secondary | ICD-10-CM | POA: Diagnosis not present

## 2021-03-26 DIAGNOSIS — Z6827 Body mass index (BMI) 27.0-27.9, adult: Secondary | ICD-10-CM

## 2021-03-26 DIAGNOSIS — R519 Headache, unspecified: Secondary | ICD-10-CM

## 2021-03-26 DIAGNOSIS — E559 Vitamin D deficiency, unspecified: Secondary | ICD-10-CM

## 2021-03-26 DIAGNOSIS — Z0000 Encounter for general adult medical examination without abnormal findings: Secondary | ICD-10-CM

## 2021-03-26 DIAGNOSIS — Z136 Encounter for screening for cardiovascular disorders: Secondary | ICD-10-CM

## 2021-03-26 DIAGNOSIS — H538 Other visual disturbances: Secondary | ICD-10-CM

## 2021-03-26 DIAGNOSIS — M722 Plantar fascial fibromatosis: Secondary | ICD-10-CM

## 2021-03-26 MED ORDER — FAMOTIDINE 20 MG PO TABS: 20.0000 mg | ORAL_TABLET | Freq: Every day | ORAL | 0 refills | Status: AC

## 2021-03-26 NOTE — Patient Instructions (Signed)

## 2021-03-27 LAB — COMPLETE METABOLIC PANEL WITH GFR
AG Ratio: 1.7 (calc) (ref 1.0–2.5)
ALT: 37 U/L (ref 9–46)
AST: 22 U/L (ref 10–40)
Albumin: 4.8 g/dL (ref 3.6–5.1)
Alkaline phosphatase (APISO): 63 U/L (ref 36–130)
BUN: 14 mg/dL (ref 7–25)
CO2: 28 mmol/L (ref 20–32)
Calcium: 9.8 mg/dL (ref 8.6–10.3)
Chloride: 101 mmol/L (ref 98–110)
Creat: 1.09 mg/dL (ref 0.60–1.29)
Globulin: 2.9 g/dL (calc) (ref 1.9–3.7)
Glucose, Bld: 89 mg/dL (ref 65–99)
Potassium: 4.5 mmol/L (ref 3.5–5.3)
Sodium: 139 mmol/L (ref 135–146)
Total Bilirubin: 0.4 mg/dL (ref 0.2–1.2)
Total Protein: 7.7 g/dL (ref 6.1–8.1)
eGFR: 85 mL/min/{1.73_m2} (ref >=60)

## 2021-03-27 LAB — URINALYSIS, ROUTINE W REFLEX MICROSCOPIC
Bilirubin Urine: NEGATIVE
Glucose, UA: NEGATIVE
Hgb urine dipstick: NEGATIVE
Ketones, ur: NEGATIVE
Leukocytes,Ua: NEGATIVE
Nitrite: NEGATIVE
Protein, ur: NEGATIVE
Specific Gravity, Urine: 1.01 (ref 1.001–1.035)
pH: 6 (ref 5.0–8.0)

## 2021-03-27 LAB — CBC WITH DIFFERENTIAL/PLATELET
Absolute Monocytes: 401 cells/uL (ref 200–950)
Basophils Absolute: 18 cells/uL (ref 0–200)
Basophils Relative: 0.4 %
Eosinophils Absolute: 81 cells/uL (ref 15–500)
Eosinophils Relative: 1.8 %
HCT: 49.6 % (ref 38.5–50.0)
Hemoglobin: 16.8 g/dL (ref 13.2–17.1)
Lymphs Abs: 1886 cells/uL (ref 850–3900)
MCH: 29.9 pg (ref 27.0–33.0)
MCHC: 33.9 g/dL (ref 32.0–36.0)
MCV: 88.3 fL (ref 80.0–100.0)
MPV: 10.8 fL (ref 7.5–12.5)
Monocytes Relative: 8.9 %
Neutro Abs: 2115 cells/uL (ref 1500–7800)
Neutrophils Relative %: 47 %
Platelets: 221 10*3/uL (ref 140–400)
RBC: 5.62 10*6/uL (ref 4.20–5.80)
RDW: 12.7 % (ref 11.0–15.0)
Total Lymphocyte: 41.9 %
WBC: 4.5 10*3/uL (ref 3.8–10.8)

## 2021-03-27 LAB — LIPID PANEL
Cholesterol: 175 mg/dL (ref <200)
HDL: 50 mg/dL (ref >=40)
LDL Cholesterol (Calc): 98 mg/dL (calc)
Non-HDL Cholesterol (Calc): 125 mg/dL (calc) (ref <130)
Total CHOL/HDL Ratio: 3.5 (calc) (ref <5.0)
Triglycerides: 167 mg/dL — ABNORMAL HIGH (ref <150)

## 2021-03-27 LAB — MICROALBUMIN / CREATININE URINE RATIO
Creatinine, Urine: 77 mg/dL (ref 20–320)
Microalb, Ur: <0.2 mg/dL

## 2021-03-27 LAB — MAGNESIUM: Magnesium: 2.2 mg/dL (ref 1.5–2.5)

## 2021-03-27 LAB — HEMOGLOBIN A1C
Hgb A1c MFr Bld: 5.4 % of total Hgb (ref <5.7)
Mean Plasma Glucose: 108 mg/dL
eAG (mmol/L): 6 mmol/L

## 2021-03-27 LAB — TSH: TSH: 2.16 mIU/L (ref 0.40–4.50)

## 2021-03-27 LAB — VITAMIN D 25 HYDROXY (VIT D DEFICIENCY, FRACTURES): Vit D, 25-Hydroxy: 37 ng/mL (ref 30–100)

## 2021-05-10 ENCOUNTER — Other Ambulatory Visit: Payer: Self-pay | Admitting: Adult Health

## 2021-09-11 ENCOUNTER — Other Ambulatory Visit: Payer: Self-pay | Admitting: Adult Health Nurse Practitioner

## 2021-09-11 ENCOUNTER — Other Ambulatory Visit: Payer: Self-pay | Admitting: Nurse Practitioner

## 2021-09-11 DIAGNOSIS — E785 Hyperlipidemia, unspecified: Secondary | ICD-10-CM

## 2021-09-11 MED ORDER — ATORVASTATIN CALCIUM 40 MG PO TABS: 40.0000 mg | ORAL_TABLET | Freq: Every day | ORAL | 1 refills | Status: DC

## 2022-03-18 ENCOUNTER — Other Ambulatory Visit: Payer: Self-pay

## 2022-03-18 DIAGNOSIS — E785 Hyperlipidemia, unspecified: Secondary | ICD-10-CM

## 2022-03-18 MED ORDER — ATORVASTATIN CALCIUM 40 MG PO TABS: 40.0000 mg | ORAL_TABLET | Freq: Every day | ORAL | 1 refills | Status: AC

## 2022-03-26 ENCOUNTER — Encounter: Payer: BC Managed Care – PPO | Admitting: Nurse Practitioner

## 2022-05-21 NOTE — Progress Notes (Deleted)
COMPLETE PHYSICAL   Assessment and Plan:   Diagnoses and all orders for this visit:  Encounter for general adult medical examination with abnormal findings Yearly -     CBC with Differential/Platelet -     COMPLETE METABOLIC PANEL WITH GFR  Hyperlipidemia, unspecified hyperlipidemia type Continue medications: Atorvastatin 20mg  nightly Discussed dietary and exercise modifications Low fat diet -     Lipid panel - TSH  GERD Continue Famotidine 20 mg QD  BMI 27.0-27.9,adult Discussed dietary and exercise modifications Has cut out sweet drinks  Vitamin D deficiency Continue supplementation to maintain goal of 70-100 Not taking Vitamin D  - vitamin D level    SCREENING FOR ANEMIA CBC  Screening for blood or protein in urine -     Urinalysis w microscopic + reflex culture - Microalbumin/creatinine urine ratio  Screening for cardiovascular condition -     EKG 12-Lead  Medication management Continued  Abnormal glucose Continue diet and exercise -     Hemoglobin A1c    . Further disposition pending results if labs check today. Discussed med's effects and SE's.   Over 30 minutes of face to face interview, exam, counseling, chart review, and critical decision making was performed.    Future Appointments  Date Time Provider [place]  05/22/2022  9:00 AM Alycia Rossetti, NP [facility] None  05/23/2023  9:00 AM Alycia Rossetti, NP [facility] None     HPI 47 y.o. male  presents for CPE has BMI 27.0-27.9,adult and Hyperlipidemia on their problem list.    He reports he does not have any health or medication concerns today.  His blood pressure has been controlled at home, today their BP is    BP Readings from Last 3 Encounters:  03/26/21 118/78  09/25/20 122/88  03/23/20 118/76   He does not workout, active work. He denies chest pain, shortness of breath, dizziness.  He  is  on cholesterol medication, is on lipitor 40mg  started last visit and denies  myalgias. His cholesterol is not at goal. The cholesterol last visit was:   Lab Results  Component Value Date   CHOL 175 03/26/2021   HDL 50 03/26/2021   LDLCALC 98 03/26/2021   TRIG 167 (H) 03/26/2021   CHOLHDL 3.5 03/26/2021   Patient is on not on Vitamin D supplement at this time.  He was not to goal last check.   Lab Results  Component Value Date   VD25OH 37 03/26/2021     BMI is There is no height or weight on file to calculate BMI., he is working on diet and exercise. Is no longer drinking sweet drinks.  Wt Readings from Last 3 Encounters:  03/26/21 193 lb 12.8 oz (87.9 kg)  09/25/20 193 lb (87.5 kg)  03/23/20 201 lb (91.2 kg)     Current Medications:  Current Outpatient Medications on File Prior to Visit  Medication Sig   atorvastatin (LIPITOR) 40 MG tablet Take 1 tablet (40 mg total) by mouth daily.   famotidine (PEPCID) 20 MG tablet Take 1 tablet (20 mg total) by mouth at bedtime.   No current facility-administered medications on file prior to visit.    Medical History:  Past Medical History:  Diagnosis Date   Hyperlipidemia 03/18/2017   MRSA colonization 03/17/2017   Treated x 2 times   Allergies: No Known Allergies   Review of Systems:  Review of Systems  Constitutional:  Negative for chills and fever.  HENT:  Negative for congestion, hearing loss,  sinus pain, sore throat and tinnitus.   Eyes:  Positive for blurred vision. Negative for double vision.  Respiratory:  Negative for cough, hemoptysis, sputum production, shortness of breath and wheezing.   Cardiovascular:  Negative for chest pain, palpitations and leg swelling.  Gastrointestinal:  Negative for abdominal pain, constipation, diarrhea, heartburn, nausea and vomiting.  Genitourinary:  Negative for dysuria and urgency.  Musculoskeletal:  Negative for back pain, falls, joint pain, myalgias and neck pain.  Skin:  Negative for rash.  Neurological:  Negative for dizziness, tingling, tremors, weakness  and headaches.  Endo/Heme/Allergies:  Does not bruise/bleed easily.  Psychiatric/Behavioral:  Negative for depression and suicidal ideas. The patient has insomnia. The patient is not nervous/anxious.     Family history- Review and unchanged Social history- Review and unchanged   Names of Other Physician/Practitioners you currently use: 1. [facility] here for primary care 2. Eye Exam, DUE will schedule 3. Dentist 2022  Patient Care Team: Unk Pinto, MD as PCP - General (Internal Medicine)    Screening Tests: Immunization History  Administered Date(s) Administered   Influenza Inj Mdck Quad With Preservative 03/17/2017, 03/19/2018   Tdap 03/17/2017    Preventative care: Last colonoscopy: Had one in 30's, cleared until 36.   Vaccinations: TD or Tdap: 2018  Influenza: Declined Pneumococcal: N/A Shingles/Zostavax: N/A   Physical Exam: There were no vitals taken for this visit. Wt Readings from Last 3 Encounters:  03/26/21 193 lb 12.8 oz (87.9 kg)  09/25/20 193 lb (87.5 kg)  03/23/20 201 lb (91.2 kg)   General Appearance: Well nourished, in no apparent distress. Eyes: PERRLA, EOMs, conjunctiva no swelling or erythema Sinuses: No Frontal/maxillary tenderness ENT/Mouth: Ext aud canals clear, TMs without erythema, bulging. No erythema, swelling, or exudate on post pharynx.  Tonsils not swollen or erythematous. Hearing normal.  Neck: Supple, thyroid normal.  Respiratory: Respiratory effort normal, BS equal bilaterally without rales, rhonchi, wheezing or stridor.  Cardio: RRR with no MRGs. Brisk peripheral pulses without edema.  Abdomen: Soft, + BS,  Non tender, no guarding, rebound, hernias, masses. Lymphatics: Non tender without lymphadenopathy.  Musculoskeletal: Full ROM, 5/5 strength, Normal gait. Foot exam reveals minimal point tenderness over the inferior aspect of right heel, without masses, deformity or edema.  The rest of  the foot and ankle exam is normal. Color and temperature of the feet is normal. Peripheral pulses are normal.  Skin: Warm, dry without rashes, lesions, ecchymosis.  Neuro: Cranial nerves intact. Normal muscle tone, no cerebellar symptoms. Psych: Awake and oriented X 3, normal affect, Insight and Judgment appropriate.  GU: defer  EKG: IRBBB, No ST changes   [facility]  05/21/2022

## 2022-05-22 ENCOUNTER — Encounter: Payer: BC Managed Care – PPO | Admitting: Nurse Practitioner

## 2022-05-22 DIAGNOSIS — Z79899 Other long term (current) drug therapy: Secondary | ICD-10-CM

## 2022-05-22 DIAGNOSIS — E559 Vitamin D deficiency, unspecified: Secondary | ICD-10-CM

## 2022-05-22 DIAGNOSIS — R7989 Other specified abnormal findings of blood chemistry: Secondary | ICD-10-CM

## 2022-05-22 DIAGNOSIS — Z130 Encounter for screening for diseases of the blood and blood-forming organs and certain disorders involving the immune mechanism: Secondary | ICD-10-CM

## 2022-05-22 DIAGNOSIS — Z6827 Body mass index (BMI) 27.0-27.9, adult: Secondary | ICD-10-CM

## 2022-05-22 DIAGNOSIS — Z1389 Encounter for screening for other disorder: Secondary | ICD-10-CM

## 2022-05-22 DIAGNOSIS — Z136 Encounter for screening for cardiovascular disorders: Secondary | ICD-10-CM

## 2022-05-22 DIAGNOSIS — R7309 Other abnormal glucose: Secondary | ICD-10-CM

## 2022-05-22 DIAGNOSIS — E785 Hyperlipidemia, unspecified: Secondary | ICD-10-CM

## 2022-05-22 DIAGNOSIS — Z0001 Encounter for general adult medical examination with abnormal findings: Secondary | ICD-10-CM

## 2022-05-22 DIAGNOSIS — Z125 Encounter for screening for malignant neoplasm of prostate: Secondary | ICD-10-CM

## 2022-05-22 DIAGNOSIS — Z0000 Encounter for general adult medical examination without abnormal findings: Secondary | ICD-10-CM

## 2022-05-22 DIAGNOSIS — Z1329 Encounter for screening for other suspected endocrine disorder: Secondary | ICD-10-CM

## 2022-05-22 DIAGNOSIS — K219 Gastro-esophageal reflux disease without esophagitis: Secondary | ICD-10-CM

## 2022-08-20 NOTE — Progress Notes (Unsigned)
COMPLETE PHYSICAL   Assessment and Plan:   Diagnoses and all orders for this visit:  Encounter for general adult medical examination with abnormal findings Yearly -     CBC with Differential/Platelet -     COMPLETE METABOLIC PANEL WITH GFR  Hyperlipidemia, unspecified hyperlipidemia type Continue medications: Atorvastatin 20mg  nightly Discussed dietary and exercise modifications Low fat diet -     Lipid panel - TSH  GERD Continue Famotidine 20 mg QD  BMI 27.0-27.9,adult Discussed dietary and exercise modifications Has cut out sweet drinks  Vitamin D deficiency Continue supplementation to maintain goal of 70-100 Not taking Vitamin D  - vitamin D level  Blurred vision Refer to optometry for evaluation   SCREENING FOR ANEMIA CBC  Screening for blood or protein in urine -     Urinalysis w microscopic + reflex culture - Microalbumin/creatinine urine ratio  Screening for cardiovascular condition -     EKG 12-Lead  Medication management Continued  Screening for diabetes mellitus -     Hemoglobin A1c    . Further disposition pending results if labs check today. Discussed med's effects and SE's.   Over 30 minutes of face to face interview, exam, counseling, chart review, and critical decision making was performed.    Future Appointments  Date Time Provider [place]  08/21/2022  9:00 AM Alycia Rossetti, NP [facility] None     HPI 47 y.o. male  presents for CPE has BMI 27.0-27.9,adult and Hyperlipidemia on their problem list.    He reports he does not have any health or medication concerns today.  His blood pressure has been controlled at home, today their BP is    Has been having some blurred vision, it has been many years since he had an eye exam.  He does not workout, active work. He denies chest pain, shortness of breath, dizziness.  He  is  on cholesterol medication, is on lipitor 40mg  started last visit and denies myalgias. His cholesterol  is not at goal. The cholesterol last visit was:   Lab Results  Component Value Date   CHOL 175 03/26/2021   HDL 50 03/26/2021   LDLCALC 98 03/26/2021   TRIG 167 (H) 03/26/2021   CHOLHDL 3.5 03/26/2021   Patient is on not on Vitamin D supplement at this time.  He was not to goal last check.   Lab Results  Component Value Date   VD25OH 37 03/26/2021     BMI is There is no height or weight on file to calculate BMI., he is working on diet and exercise. Is no longer drinking sweet drinks.  Wt Readings from Last 3 Encounters:  03/26/21 193 lb 12.8 oz (87.9 kg)  09/25/20 193 lb (87.5 kg)  03/23/20 201 lb (91.2 kg)     Current Medications:  Current Outpatient Medications on File Prior to Visit  Medication Sig   atorvastatin (LIPITOR) 40 MG tablet Take 1 tablet (40 mg total) by mouth daily.   famotidine (PEPCID) 20 MG tablet Take 1 tablet (20 mg total) by mouth at bedtime.   No current facility-administered medications on file prior to visit.    Medical History:  Past Medical History:  Diagnosis Date   Hyperlipidemia 03/18/2017   MRSA colonization 03/17/2017   Treated x 2 times   Allergies: No Known Allergies   Review of Systems:  Review of Systems  Constitutional:  Negative for chills and fever.  HENT:  Negative for congestion, hearing loss, sinus pain, sore throat and  tinnitus.   Eyes:  Positive for blurred vision. Negative for double vision.  Respiratory:  Negative for cough, hemoptysis, sputum production, shortness of breath and wheezing.   Cardiovascular:  Negative for chest pain, palpitations and leg swelling.  Gastrointestinal:  Negative for abdominal pain, constipation, diarrhea, heartburn, nausea and vomiting.  Genitourinary:  Negative for dysuria and urgency.  Musculoskeletal:  Negative for back pain, falls, joint pain, myalgias and neck pain.  Skin:  Negative for rash.  Neurological:  Negative for dizziness, tingling, tremors, weakness and headaches.   Endo/Heme/Allergies:  Does not bruise/bleed easily.  Psychiatric/Behavioral:  Negative for depression and suicidal ideas. The patient has insomnia. The patient is not nervous/anxious.     Family history- Review and unchanged Social history- Review and unchanged   Names of Other Physician/Practitioners you currently use: 1. [facility] here for primary care 2. Eye Exam, DUE will schedule 3. Dentist 2022  Patient Care Team: Unk Pinto, MD as PCP - General (Internal Medicine)    Screening Tests: Immunization History  Administered Date(s) Administered   Influenza Inj Mdck Quad With Preservative 03/17/2017, 03/19/2018   Tdap 03/17/2017    Preventative care: Last colonoscopy: Had one in 30's, cleared until 63.   Vaccinations: TD or Tdap: 2018  Influenza: Declined Pneumococcal: N/A Shingles/Zostavax: N/A   Physical Exam: There were no vitals taken for this visit. Wt Readings from Last 3 Encounters:  03/26/21 193 lb 12.8 oz (87.9 kg)  09/25/20 193 lb (87.5 kg)  03/23/20 201 lb (91.2 kg)   General Appearance: Well nourished, in no apparent distress. Eyes: PERRLA, EOMs, conjunctiva no swelling or erythema Sinuses: No Frontal/maxillary tenderness ENT/Mouth: Ext aud canals clear, TMs without erythema, bulging. No erythema, swelling, or exudate on post pharynx.  Tonsils not swollen or erythematous. Hearing normal.  Neck: Supple, thyroid normal.  Respiratory: Respiratory effort normal, BS equal bilaterally without rales, rhonchi, wheezing or stridor.  Cardio: RRR with no MRGs. Brisk peripheral pulses without edema.  Abdomen: Soft, + BS,  Non tender, no guarding, rebound, hernias, masses. Lymphatics: Non tender without lymphadenopathy.  Musculoskeletal: Full ROM, 5/5 strength, Normal gait. Foot exam reveals minimal point tenderness over the inferior aspect of right heel, without masses, deformity or edema.  The rest of the foot and  ankle exam is normal. Color and temperature of the feet is normal. Peripheral pulses are normal.  Skin: Warm, dry without rashes, lesions, ecchymosis.  Neuro: Cranial nerves intact. Normal muscle tone, no cerebellar symptoms. Psych: Awake and oriented X 3, normal affect, Insight and Judgment appropriate.  GU: defer  EKG: IRBBB, No ST changes   [facility]  08/20/2022

## 2022-08-21 ENCOUNTER — Ambulatory Visit (INDEPENDENT_AMBULATORY_CARE_PROVIDER_SITE_OTHER): Payer: BC Managed Care – PPO | Admitting: Nurse Practitioner

## 2022-08-21 ENCOUNTER — Encounter: Payer: Self-pay | Admitting: Nurse Practitioner

## 2022-08-21 VITALS — BP 120/74 | HR 66 | Temp 97.5°F | Ht 71.0 in | Wt 204.2 lb

## 2022-08-21 DIAGNOSIS — Z1329 Encounter for screening for other suspected endocrine disorder: Secondary | ICD-10-CM

## 2022-08-21 DIAGNOSIS — E559 Vitamin D deficiency, unspecified: Secondary | ICD-10-CM | POA: Diagnosis not present

## 2022-08-21 DIAGNOSIS — Z136 Encounter for screening for cardiovascular disorders: Secondary | ICD-10-CM | POA: Diagnosis not present

## 2022-08-21 DIAGNOSIS — E785 Hyperlipidemia, unspecified: Secondary | ICD-10-CM

## 2022-08-21 DIAGNOSIS — H538 Other visual disturbances: Secondary | ICD-10-CM

## 2022-08-21 DIAGNOSIS — I10 Essential (primary) hypertension: Secondary | ICD-10-CM

## 2022-08-21 DIAGNOSIS — Z13220 Encounter for screening for lipoid disorders: Secondary | ICD-10-CM

## 2022-08-21 DIAGNOSIS — Z0000 Encounter for general adult medical examination without abnormal findings: Secondary | ICD-10-CM | POA: Diagnosis not present

## 2022-08-21 DIAGNOSIS — Z79899 Other long term (current) drug therapy: Secondary | ICD-10-CM | POA: Diagnosis not present

## 2022-08-21 DIAGNOSIS — Z1389 Encounter for screening for other disorder: Secondary | ICD-10-CM | POA: Diagnosis not present

## 2022-08-21 DIAGNOSIS — Z6827 Body mass index (BMI) 27.0-27.9, adult: Secondary | ICD-10-CM

## 2022-08-21 DIAGNOSIS — Z130 Encounter for screening for diseases of the blood and blood-forming organs and certain disorders involving the immune mechanism: Secondary | ICD-10-CM

## 2022-08-21 DIAGNOSIS — K219 Gastro-esophageal reflux disease without esophagitis: Secondary | ICD-10-CM

## 2022-08-21 DIAGNOSIS — Z0001 Encounter for general adult medical examination with abnormal findings: Secondary | ICD-10-CM

## 2022-08-21 DIAGNOSIS — Z131 Encounter for screening for diabetes mellitus: Secondary | ICD-10-CM | POA: Diagnosis not present

## 2022-08-21 NOTE — Patient Instructions (Signed)

## 2022-08-22 ENCOUNTER — Other Ambulatory Visit: Payer: Self-pay | Admitting: Nurse Practitioner

## 2022-08-22 DIAGNOSIS — R7989 Other specified abnormal findings of blood chemistry: Secondary | ICD-10-CM

## 2022-08-22 LAB — URINALYSIS, ROUTINE W REFLEX MICROSCOPIC
Bilirubin Urine: NEGATIVE
Glucose, UA: NEGATIVE
Hgb urine dipstick: NEGATIVE
Ketones, ur: NEGATIVE
Leukocytes,Ua: NEGATIVE
Nitrite: NEGATIVE
Protein, ur: NEGATIVE
Specific Gravity, Urine: 1.025 (ref 1.001–1.035)
pH: 6.5 (ref 5.0–8.0)

## 2022-08-22 LAB — CBC WITH DIFFERENTIAL/PLATELET
Absolute Monocytes: 419 cells/uL (ref 200–950)
Basophils Absolute: 32 cells/uL (ref 0–200)
Basophils Relative: 0.7 %
Eosinophils Absolute: 92 cells/uL (ref 15–500)
Eosinophils Relative: 2 %
HCT: 46.7 % (ref 38.5–50.0)
Hemoglobin: 16.2 g/dL (ref 13.2–17.1)
Lymphs Abs: 1702 cells/uL (ref 850–3900)
MCH: 30.2 pg (ref 27.0–33.0)
MCHC: 34.7 g/dL (ref 32.0–36.0)
MCV: 87.1 fL (ref 80.0–100.0)
MPV: 11.3 fL (ref 7.5–12.5)
Monocytes Relative: 9.1 %
Neutro Abs: 2355 cells/uL (ref 1500–7800)
Neutrophils Relative %: 51.2 %
Platelets: 225 10*3/uL (ref 140–400)
RBC: 5.36 10*6/uL (ref 4.20–5.80)
RDW: 12.8 % (ref 11.0–15.0)
Total Lymphocyte: 37 %
WBC: 4.6 10*3/uL (ref 3.8–10.8)

## 2022-08-22 LAB — HEMOGLOBIN A1C
Hgb A1c MFr Bld: 5.7 % of total Hgb — ABNORMAL HIGH (ref <5.7)
Mean Plasma Glucose: 117 mg/dL
eAG (mmol/L): 6.5 mmol/L

## 2022-08-22 LAB — COMPLETE METABOLIC PANEL WITH GFR
AG Ratio: 1.6 (calc) (ref 1.0–2.5)
ALT: 60 U/L — ABNORMAL HIGH (ref 9–46)
AST: 32 U/L (ref 10–40)
Albumin: 4.5 g/dL (ref 3.6–5.1)
Alkaline phosphatase (APISO): 63 U/L (ref 36–130)
BUN: 21 mg/dL (ref 7–25)
CO2: 29 mmol/L (ref 20–32)
Calcium: 9.7 mg/dL (ref 8.6–10.3)
Chloride: 103 mmol/L (ref 98–110)
Creat: 1.07 mg/dL (ref 0.60–1.29)
Globulin: 2.8 g/dL (calc) (ref 1.9–3.7)
Glucose, Bld: 90 mg/dL (ref 65–99)
Potassium: 4.1 mmol/L (ref 3.5–5.3)
Sodium: 142 mmol/L (ref 135–146)
Total Bilirubin: 0.6 mg/dL (ref 0.2–1.2)
Total Protein: 7.3 g/dL (ref 6.1–8.1)
eGFR: 86 mL/min/{1.73_m2} (ref >=60)

## 2022-08-22 LAB — VITAMIN D 25 HYDROXY (VIT D DEFICIENCY, FRACTURES): Vit D, 25-Hydroxy: 26 ng/mL — ABNORMAL LOW (ref 30–100)

## 2022-08-22 LAB — MICROALBUMIN / CREATININE URINE RATIO
Creatinine, Urine: 206 mg/dL (ref 20–320)
Microalb, Ur: <0.2 mg/dL

## 2022-08-22 LAB — LIPID PANEL
Cholesterol: 283 mg/dL — ABNORMAL HIGH (ref <200)
HDL: 45 mg/dL (ref >=40)
LDL Cholesterol (Calc): 197 mg/dL (calc) — ABNORMAL HIGH
Non-HDL Cholesterol (Calc): 238 mg/dL (calc) — ABNORMAL HIGH (ref <130)
Total CHOL/HDL Ratio: 6.3 (calc) — ABNORMAL HIGH (ref <5.0)
Triglycerides: 221 mg/dL — ABNORMAL HIGH (ref <150)

## 2022-08-22 LAB — TSH: TSH: 1.83 mIU/L (ref 0.40–4.50)

## 2022-08-22 LAB — MAGNESIUM: Magnesium: 2.2 mg/dL (ref 1.5–2.5)

## 2022-08-22 NOTE — Progress Notes (Signed)
Please schedule a lab visit for 1 month to recheck liver functions

## 2022-08-26 ENCOUNTER — Encounter: Payer: Self-pay | Admitting: Nurse Practitioner

## 2022-08-26 ENCOUNTER — Other Ambulatory Visit: Payer: Self-pay | Admitting: Nurse Practitioner

## 2022-08-26 DIAGNOSIS — E785 Hyperlipidemia, unspecified: Secondary | ICD-10-CM

## 2022-08-26 MED ORDER — EZETIMIBE 10 MG PO TABS
10.0000 mg | ORAL_TABLET | Freq: Every day | ORAL | 11 refills | Status: AC
Start: 2022-08-26 — End: 2023-08-26

## 2022-09-20 ENCOUNTER — Other Ambulatory Visit: Payer: BC Managed Care – PPO

## 2022-09-20 ENCOUNTER — Other Ambulatory Visit: Payer: Self-pay

## 2022-09-20 DIAGNOSIS — R7989 Other specified abnormal findings of blood chemistry: Secondary | ICD-10-CM | POA: Diagnosis not present

## 2022-09-21 LAB — HEPATIC FUNCTION PANEL
AG Ratio: 1.6 (calc) (ref 1.0–2.5)
ALT: 39 U/L (ref 9–46)
AST: 20 U/L (ref 10–40)
Albumin: 4.6 g/dL (ref 3.6–5.1)
Alkaline phosphatase (APISO): 63 U/L (ref 36–130)
Bilirubin, Direct: 0.1 mg/dL (ref 0.0–0.2)
Globulin: 2.8 g/dL (calc) (ref 1.9–3.7)
Indirect Bilirubin: 0.4 mg/dL (calc) (ref 0.2–1.2)
Total Bilirubin: 0.5 mg/dL (ref 0.2–1.2)
Total Protein: 7.4 g/dL (ref 6.1–8.1)

## 2023-05-23 ENCOUNTER — Encounter: Payer: BC Managed Care – PPO | Admitting: Nurse Practitioner

## 2023-08-21 ENCOUNTER — Encounter: Payer: BC Managed Care – PPO | Admitting: Nurse Practitioner
